# Patient Record
Sex: Female | Born: 1935 | Race: White | Hispanic: No | Marital: Married | Smoking: Former smoker
Health system: Southern US, Community
[De-identification: ages and names within clinical notes are randomized; demographics above are authoritative.]

## PROBLEM LIST (undated history)

## (undated) DIAGNOSIS — I10 Essential (primary) hypertension: Secondary | ICD-10-CM

## (undated) DIAGNOSIS — G250 Essential tremor: Secondary | ICD-10-CM

## (undated) DIAGNOSIS — B0229 Other postherpetic nervous system involvement: Secondary | ICD-10-CM

## (undated) DIAGNOSIS — G252 Other specified forms of tremor: Secondary | ICD-10-CM

## (undated) DIAGNOSIS — G4700 Insomnia, unspecified: Secondary | ICD-10-CM

## (undated) DIAGNOSIS — R300 Dysuria: Secondary | ICD-10-CM

## (undated) HISTORY — DX: Essential (primary) hypertension: I10

## (undated) HISTORY — DX: Dysuria: R30.0

## (undated) HISTORY — PX: APPENDECTOMY: SHX54

## (undated) HISTORY — DX: Other postherpetic nervous system involvement: B02.29

## (undated) HISTORY — DX: Other specified forms of tremor: G25.2

## (undated) HISTORY — DX: Hypercalcemia: E83.52

## (undated) HISTORY — DX: Insomnia, unspecified: G47.00

## (undated) HISTORY — DX: Essential tremor: G25.0

---

## 1999-09-27 HISTORY — PX: ABDOMINAL HYSTERECTOMY: SHX81

## 2000-02-10 ENCOUNTER — Encounter: Admission: RE | Admit: 2000-02-10 | Discharge: 2000-02-10 | Payer: Self-pay | Admitting: *Deleted

## 2000-03-07 ENCOUNTER — Other Ambulatory Visit: Admission: RE | Admit: 2000-03-07 | Discharge: 2000-03-07 | Payer: Self-pay | Admitting: Gynecology

## 2000-03-07 ENCOUNTER — Ambulatory Visit: Admission: RE | Admit: 2000-03-07 | Discharge: 2000-03-07 | Payer: Self-pay | Admitting: Gynecology

## 2000-03-13 ENCOUNTER — Encounter: Payer: Self-pay | Admitting: Gynecology

## 2000-03-15 ENCOUNTER — Encounter (INDEPENDENT_AMBULATORY_CARE_PROVIDER_SITE_OTHER): Payer: Self-pay | Admitting: Specialist

## 2000-03-15 ENCOUNTER — Encounter (INDEPENDENT_AMBULATORY_CARE_PROVIDER_SITE_OTHER): Payer: Self-pay

## 2000-03-15 ENCOUNTER — Inpatient Hospital Stay (HOSPITAL_COMMUNITY): Admission: RE | Admit: 2000-03-15 | Discharge: 2000-03-18 | Payer: Self-pay | Admitting: Gynecology

## 2000-04-26 ENCOUNTER — Ambulatory Visit: Admission: RE | Admit: 2000-04-26 | Discharge: 2000-04-26 | Payer: Self-pay | Admitting: Gynecology

## 2002-02-12 ENCOUNTER — Ambulatory Visit (HOSPITAL_COMMUNITY): Admission: RE | Admit: 2002-02-12 | Discharge: 2002-02-12 | Payer: Self-pay | Admitting: Family Medicine

## 2006-02-05 ENCOUNTER — Inpatient Hospital Stay (HOSPITAL_COMMUNITY): Admission: EM | Admit: 2006-02-05 | Discharge: 2006-02-09 | Payer: Self-pay | Admitting: Emergency Medicine

## 2006-02-07 ENCOUNTER — Encounter (INDEPENDENT_AMBULATORY_CARE_PROVIDER_SITE_OTHER): Payer: Self-pay | Admitting: Cardiology

## 2008-10-30 ENCOUNTER — Encounter: Payer: Self-pay | Admitting: Family Medicine

## 2009-04-29 ENCOUNTER — Ambulatory Visit: Payer: Self-pay | Admitting: Family Medicine

## 2009-04-29 DIAGNOSIS — I10 Essential (primary) hypertension: Secondary | ICD-10-CM | POA: Insufficient documentation

## 2009-04-29 DIAGNOSIS — G4700 Insomnia, unspecified: Secondary | ICD-10-CM

## 2009-04-29 HISTORY — DX: Essential (primary) hypertension: I10

## 2009-04-29 HISTORY — DX: Insomnia, unspecified: G47.00

## 2009-10-29 ENCOUNTER — Ambulatory Visit: Payer: Self-pay | Admitting: Family Medicine

## 2009-10-29 DIAGNOSIS — G250 Essential tremor: Secondary | ICD-10-CM

## 2009-10-29 DIAGNOSIS — R300 Dysuria: Secondary | ICD-10-CM

## 2009-10-29 DIAGNOSIS — G252 Other specified forms of tremor: Secondary | ICD-10-CM

## 2009-10-29 HISTORY — DX: Dysuria: R30.0

## 2009-10-29 HISTORY — DX: Essential tremor: G25.0

## 2009-10-29 LAB — CONVERTED CEMR LAB
Glucose, Urine, Semiquant: NEGATIVE
Protein, U semiquant: NEGATIVE
Specific Gravity, Urine: 1.01
Urobilinogen, UA: 0.2
pH: 7.5

## 2009-10-30 LAB — CONVERTED CEMR LAB
Chloride: 99 meq/L (ref 96–112)
Creatinine, Ser: 1 mg/dL (ref 0.4–1.2)
Glucose, Bld: 95 mg/dL (ref 70–99)

## 2009-11-09 ENCOUNTER — Telehealth: Payer: Self-pay | Admitting: Family Medicine

## 2010-04-29 ENCOUNTER — Ambulatory Visit: Payer: Self-pay | Admitting: Family Medicine

## 2010-04-29 DIAGNOSIS — B0229 Other postherpetic nervous system involvement: Secondary | ICD-10-CM

## 2010-04-29 HISTORY — DX: Other postherpetic nervous system involvement: B02.29

## 2010-04-29 HISTORY — DX: Hypercalcemia: E83.52

## 2010-04-30 LAB — CONVERTED CEMR LAB
AST: 72 units/L — ABNORMAL HIGH (ref 0–37)
Albumin: 3.9 g/dL (ref 3.5–5.2)
BUN: 18 mg/dL (ref 6–23)
Bilirubin, Direct: 0.1 mg/dL (ref 0.0–0.3)
CO2: 36 meq/L — ABNORMAL HIGH (ref 19–32)
Calcium: 11 mg/dL — ABNORMAL HIGH (ref 8.4–10.5)
Chloride: 96 meq/L (ref 96–112)
Potassium: 3.9 meq/L (ref 3.5–5.1)
Total Bilirubin: 0.5 mg/dL (ref 0.3–1.2)

## 2010-09-29 ENCOUNTER — Ambulatory Visit
Admission: RE | Admit: 2010-09-29 | Discharge: 2010-09-29 | Payer: Self-pay | Source: Home / Self Care | Attending: Family Medicine | Admitting: Family Medicine

## 2010-10-11 ENCOUNTER — Ambulatory Visit
Admission: RE | Admit: 2010-10-11 | Discharge: 2010-10-11 | Payer: Self-pay | Source: Home / Self Care | Attending: Family Medicine | Admitting: Family Medicine

## 2010-10-18 ENCOUNTER — Encounter: Payer: Self-pay | Admitting: Family Medicine

## 2010-10-19 ENCOUNTER — Encounter: Payer: Self-pay | Admitting: Family Medicine

## 2010-10-26 NOTE — Progress Notes (Signed)
Summary: REQ FOR MEDS , sinus pressure, cough  Phone Note Call from Patient   Caller: Patient [phone] Call For: Rachel Peat MD Reason for Call: Talk to Nurse, Talk to Doctor Summary of Call: Pt called to speak with Harriett Sine, LPN for Dr Caryl Never.... Pt adv the she would like to have meds sent into [facility] for c/o : sinus pressure, congestion (head/chest), ST, fatigue, initiated productive cough....OR..... if someone could call her to suggest meds.... Pt states that she had these sxs before and was diagnosed with acute bronchitis.Marland KitchenMarland KitchenMarland KitchenHas tried OTC products w/ no relief.Marland KitchenMarland KitchenMarland KitchenPt adv that she just came in for OV on 02/03/2011and didn't want to come in if she didn't have to??? ..... Pt can be reached at [phone].  Initial call taken by: Debbra Riding,  November 09, 2009 9:08 AM  Follow-up for Phone Call        OK to call in Amoxicillin 875 mg by mouth two times a day for 10 days and prompt office f/u if not improving or worsening next few days. Follow-up by: Rachel Peat MD,  November 09, 2009 10:08 AM  Additional Follow-up for Phone Call Additional follow up Details #1::        Rx faxed, pt informed and she voiced her understanding to call in not improving over the next few days Additional Follow-up by: Sid Falcon LPN,  November 09, 2009 10:42 AM    New/Updated Medications: AMOXICILLIN 875 MG TABS (AMOXICILLIN) one tab two times a day X 10 days Prescriptions: AMOXICILLIN 875 MG TABS (AMOXICILLIN) one tab two times a day X 10 days  #20 x 0   Entered by:   Sid Falcon LPN   Authorized by:   Rachel Peat MD   Signed by:   Sid Falcon LPN on 30/86/5784   Method used:   Faxed to ...       Hospital doctor (retail)       [address]       [place], [place]  [address]       Ph: [phone] or [phone]       Fax: [phone]   RxID:   [id number] AMOXICILLIN 875 MG TABS (AMOXICILLIN) one tab two times a day X 10 days  #20  x 0   Entered by:   Sid Falcon LPN   Authorized by:   Rachel Peat MD   Signed by:   Sid Falcon LPN on 16/60/6301   Method used:   Print then Give to Patient   RxID:   [id number]

## 2010-10-26 NOTE — Assessment & Plan Note (Signed)
Summary: 6 MONTH FUP//CCM   Vital Signs:  Patient profile:   77 year old female Menstrual status:  postmenopausal Weight:      104 pounds Temp:     97.8 degrees F oral BP sitting:   140 / 80  (left arm) Cuff size:   regular  Vitals Entered By: Sid Falcon LPN (April 29, 2010 1:06 PM) CC: 6 month follow-up   History of Present Illness: Patient seen today for the following  History of shingles left face. Still has some intermittent pain 6/10 severity. Tylenol helped slightly. She is reluctant to take further medications. Sleeping okay. No recent recurrent rash.  Hypertension treated for several years with low-dose HCTZ.  Blood pressures have been stable.  Chronic insomnia. Takes Klonopin 0.5 mg q.h.s. No significant daytime naps.  History of mild hypercalcemia several months ago. Possibly secondary to hydrochlorothiazide. Needs reassessment. No history of sarcoidosis, hyperparathyroidism, hyperthyroidism, or malignancy.  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    Physical  Exam  General:  Well-developed,well-nourished,in no acute distress; alert,appropriate and cooperative throughout examination Head:  Normocephalic and atraumatic without obvious abnormalities. No apparent alopecia or balding. Ears:  External ear exam shows no significant lesions or deformities.  Otoscopic examination reveals clear canals, tympanic membranes are intact bilaterally without bulging, retraction, inflammation or discharge. Hearing is grossly normal bilaterally. Mouth:  Oral mucosa and oropharynx without lesions or exudates.  Teeth in good repair. Neck:  No deformities, masses, or tenderness noted. Lungs:  Normal respiratory effort, chest expands symmetrically. Lungs are clear to auscultation, no crackles or wheezes. Heart:  normal rate and regular rhythm.   Extremities:  no edema or clubbing Neurologic:  alert & oriented X3 and cranial nerves II-XII intact.   Cervical Nodes:  No lymphadenopathy noted Psych:  normally interactive, good eye contact, not anxious appearing, and not depressed appearing.     Impression & Recommendations:  Problem # 1:  INSOMNIA, CHRONIC (ICD-307.42)  sleep hygiene discussed.  Refill klonopin.  Orders: Prescription Created Electronically [phone])  Problem # 2:  HYPERTENSION (ICD-401.9)  Her updated medication list for this problem includes:    Hydrochlorothiazide 25 Mg Tabs (Hydrochlorothiazide) ..... Once daily  Orders: Specimen Handling (88416) TLB-BMP (Basic Metabolic Panel-BMET) (80048-METABOL) TLB-Hepatic/Liver Function Pnl (80076-HEPATIC)  Problem # 3:  POSTHERPETIC NEURALGIA (ICD-053.19) discuss possible medication options. This point she is reluctant to add further medication  Problem # 4:  HYPERCALCEMIA (ICD-275.42)  reassess calcium today along with albumin.  Complete Medication List: 1)  Hydrochlorothiazide 25 Mg Tabs (Hydrochlorothiazide) .... Once daily 2)  Klonopin 0.5 Mg Tabs (Clonazepam) .... At bedtime 3)  Fish Oil 1200  Mg Caps (Omega-3 fatty acids) .... Three times a day 4)  Calcium-vitamin D 250-125 Mg-unit Tabs (Calcium carbonate-vitamin d) .... Two tabs  daily 5)  Centrum Silver Ultra Womens Tabs (Multiple vitamins-minerals) .... Once daily 6)  Amoxicillin 875 Mg Tabs (Amoxicillin) .... One tab two times a day x 10 days  Patient Instructions: 1)  Please schedule a follow-up appointment in 6 months .  Prescriptions: KLONOPIN 0.5 MG TABS (CLONAZEPAM) at bedtime  #30 x 5   Entered and Authorized by:   Evelena Peat MD   Signed by:   Evelena Peat MD on 04/29/2010   Method used:   Print then Give to Patient   RxID:   [id number]

## 2010-10-26 NOTE — Assessment & Plan Note (Signed)
Summary: 6 month rov/njr   Vital Signs:  Patient profile:   77 year old female Menstrual status:  postmenopausal Weight:      104 pounds Temp:     98.5 degrees F oral BP sitting:   150 / 78  (left arm) Cuff size:   regular  Vitals Entered By: Sid Falcon LPN (October 29, 2009 1:15 PM) CC: 6 month follow-up, Hypertension Management   History of Present Illness: Seen today for the following.  Intermittent burning off and on with urination with mild symptoms for the past several weeks. No back pain, fever, or chills. No vaginal discharge. Symptoms are very intermittent.  Hypertension treated with hydrochlorothiazide. No orthostatic symptoms. Compliant with medication. No side effects.  Chronic insomnia. Takes low-dose Klonopin 0.5 mg at night. Tried tapering off but had severe insomnia. No recent falls.  Long history of tremor involving mostly upper extremities. Somewhat progressive. Not disabling. Positive family history of tremor her father. No slowing of gait or any other new neurologic findings  Hypertension History:      She denies headache, chest pain, palpitations, dyspnea with exertion, orthopnea, PND, peripheral edema, visual symptoms, neurologic problems, syncope, and side effects from treatment.        Positive major cardiovascular risk factors include female age 75 years old or older, hypertension, and current tobacco user.     Allergies: No Known Drug Allergies  Past History:  Past Medical History: Last updated: 04/29/2009 Hypertension UTI Chicken pox chronic insomnia COPD Hypercholestolemia  Past Surgical History: Last updated: 04/29/2009 Appendectomy Hysterectomy 2001  Social History: Last updated: 04/29/2009 Retired  Diplomatic Services operational officer Married Past smoker, quit 2001 Alcohol use-no PMH-FH-SH reviewed for relevance  Review of Systems  The patient denies anorexia, fever, weight loss, chest pain, syncope, dyspnea on exertion, peripheral edema, prolonged  cough, headaches, hemoptysis, and abdominal pain.    Physical Exam  General:  Well-developed,well-nourished,in no acute distress; alert,appropriate and cooperative throughout examination Ears:  External ear exam shows no significant lesions or deformities.  Otoscopic examination reveals clear canals, tympanic membranes are intact bilaterally without bulging, retraction, inflammation or discharge. Hearing is grossly normal bilaterally. Nose:  External nasal examination shows no deformity or inflammation. Nasal mucosa are pink and moist without lesions or exudates. Mouth:  Oral mucosa and oropharynx without lesions or exudates.  Teeth in good repair. Neck:  No deformities, masses, or tenderness noted. Lungs:  Normal respiratory effort, chest expands symmetrically. Lungs are clear to auscultation, no crackles or wheezes. Heart:  normal rate and regular rhythm.   Extremities:  no edema Neurologic:  tremor involving mostly upper extremities head and neck which is exacerbated with movement.alert & oriented X3, cranial nerves II-XII intact, and gait normal.     Impression & Recommendations:  Problem # 1:  INSOMNIA, CHRONIC (ICD-307.42)  Problem # 2:  HYPERTENSION (ICD-401.9) refill meds and check labs. Her updated medication list for this problem includes:    Hydrochlorothiazide 25 Mg Tabs (Hydrochlorothiazide) ..... Once daily  Orders: Venipuncture (31497) TLB-BMP (Basic Metabolic Panel-BMET) (80048-METABOL)  Problem # 3:  FAMILIAL TREMOR (ICD-333.1) Discuss possible treatment options the patient is not interested at this time  Problem # 4:  DYSURIA (ICD-788.1) Urine dip unremarkable.  ?atrophic vaginitis.  Pt not interested in any further treatments. Orders: UA Dipstick w/o Micro (manual) (02637)  Complete Medication List: 1)  Hydrochlorothiazide 25 Mg Tabs (Hydrochlorothiazide) .... Once daily 2)  Klonopin 0.5 Mg Tabs (Clonazepam) .... At bedtime 3)  Fish Oil 1200 Mg Caps (Omega-3  fatty acids) .... Three times a day 4)  Calcium-vitamin D 250-125 Mg-unit Tabs (Calcium carbonate-vitamin d) .... Two tabs  daily 5)  Centrum Silver Ultra Womens Tabs (Multiple vitamins-minerals) .... Once daily  Hypertension Assessment/Plan:      The patient's hypertensive risk group is category B: At least one risk factor (excluding diabetes) with no target organ damage.  Today's blood pressure is 150/78.    Patient Instructions: 1)  Please schedule a follow-up appointment in 6 months .  2)  followup promptly if you develop any fever or worsening urinary symptoms. Prescriptions: KLONOPIN 0.5 MG TABS (CLONAZEPAM) at bedtime  #30 x 5   Entered and Authorized by:   Evelena Peat MD   Signed by:   Evelena Peat MD on 10/29/2009   Method used:   Print then Give to Patient   RxID:   [id number] HYDROCHLOROTHIAZIDE 25 MG TABS (HYDROCHLOROTHIAZIDE) once daily  #30 x 11   Entered and Authorized by:   Evelena Peat MD   Signed by:   Evelena Peat MD on 10/29/2009   Method used:   Faxed to ...       Hospital doctor (retail)       [address]       [place], [place]  [address]       Ph: [phone] or [phone]       Fax: [phone]   RxID:   [id number]   Laboratory Results   Urine Tests    Routine Urinalysis   Color: yellow Appearance: Clear Glucose: negative   (Normal Range: Negative) Bilirubin: negative   (Normal Range: Negative) Ketone: negative   (Normal Range: Negative) Spec. Gravity: 1.010   (Normal Range: 1.003-1.035) Blood: trace-lysed   (Normal Range: Negative) pH: 7.5   (Normal Range: 5.0-8.0) Protein: negative   (Normal Range: Negative) Urobilinogen: 0.2   (Normal Range: 0-1) Nitrite: negative   (Normal Range: Negative) Leukocyte Esterace: negative   (Normal Range: Negative)    Comments: Sid Falcon LPN  October 29, 2009 1:27 PM

## 2010-10-28 NOTE — Assessment & Plan Note (Signed)
Summary: right shoulder pain - rv   Vital Signs:  Patient profile:   77 year old female Menstrual status:  postmenopausal Weight:      104 pounds Temp:     98.4 degrees F oral BP sitting:   164 / 80  (left arm) Cuff size:   regular  Vitals Entered By: Sid Falcon LPN (September 29, 2010 4:07 PM)  History of Present Illness: L shoulder pain onset Friday night. No injury.  Pain is sharp to dull. Advil helps.  No neck pain.  Radiates to elbow. Pain at rest . no clear exacerbating factors.  Heat helps. No chest pain or pleuritic pain.  Hypertension which has generally been well controlled.  Compliant with meds. No headaches or dizziness.  Hypertension History:      She denies headache, chest pain, palpitations, dyspnea with exertion, orthopnea, peripheral edema, visual symptoms, and neurologic problems.  She notes no problems with any antihypertensive medication side effects.        Positive major cardiovascular risk factors include female age 54 years old or older, hypertension, and current tobacco user.     Allergies (verified): No Known Drug Allergies  Past History:  Past Surgical History: Last updated: 04/29/2009 Appendectomy Hysterectomy 2001  Family History: Last updated: 04/29/2009 Family History Breast cancer  Family History of Colon CA  Family History Diabetes  Family History High cholesterol Family History Hypertension  Social History: Last updated: 04/29/2009 Retired  Diplomatic Services operational officer Married Past smoker, quit 2001 Alcohol use-no  Risk Factors: Smoking Status: current (04/29/2009)  Past Medical History: Hypertension chronic insomnia COPD Hypercholestolemia PMH-FH-SH reviewed for relevance  Review of Systems      See HPI  Physical Exam  General:  Well-developed,well-nourished,in no acute distress; alert,appropriate and cooperative throughout examination Eyes:  pupils equal, pupils round, and pupils reactive to light.   Neck:  No deformities,  masses, or tenderness noted. Lungs:  Normal respiratory effort, chest expands symmetrically. Lungs are clear to auscultation, no crackles or wheezes. Heart:  normal rate and regular rhythm.   Extremities:  L shoulder no edema.  Good ROM but has tenderness subacromial region.  Pain with int rotation and abduction .  Good distal wrist pulses. Neurologic:  alert & oriented X3 and cranial nerves II-XII intact.     Impression & Recommendations:  Problem # 1:  SHOULDER PAIN (ICD-719.41)  Suspect rotator cuff tendonitis vs bursitis.  Rec consider steroid injection and avoid further NSAIDS, esp with her age and elev BP. discussed risks and benefits of subacromial injection of steroid and pt consents.  L shoulder prepped with betadine and using 25 gauge 1inch needle injected 40 mg depomedrol and 1 cc plain xylocaine.  Orders: Joint Aspirate / Injection, Large (20610)  Problem # 2:  HYPERTENSION (ICD-401.9) Assessment: Deteriorated probably exacerbated by pain and recent NSaid use.  reassess in 2 weeks and consider additional meds then if no better. Her updated medication list for this problem includes:    Hydrochlorothiazide 25 Mg Tabs (Hydrochlorothiazide) ..... Once daily  Complete Medication List: 1)  Hydrochlorothiazide 25 Mg Tabs (Hydrochlorothiazide) .... Once daily 2)  Klonopin 0.5 Mg Tabs (Clonazepam) .... At bedtime 3)  Fish Oil 1200 Mg Caps (Omega-3 fatty acids) .... Three times a day 4)  Calcium-vitamin D 250-125 Mg-unit Tabs (Calcium carbonate-vitamin d) .... Two tabs  daily 5)  Centrum Silver Ultra Womens Tabs (Multiple vitamins-minerals) .... Once daily 6)  Amoxicillin 875 Mg Tabs (Amoxicillin) .... One tab two times a day x 10  days  Hypertension Assessment/Plan:      The patient's hypertensive risk group is category B: At least one risk factor (excluding diabetes) with no target organ damage.  Today's blood pressure is 164/80.    Patient Instructions: 1)  Avoid further  use  of Advil. 2)  Please schedule a follow-up appointment in 2 weeks.    Orders Added: 1)  Joint Aspirate / Injection, Large [20610] 2)  Est. Patient Level III [16109]

## 2010-10-28 NOTE — Assessment & Plan Note (Signed)
Summary: 2 wk rov/njr   Vital Signs:  Patient profile:   77 year old female Menstrual status:  postmenopausal Weight:      101.5 pounds Temp:     98.8 degrees F oral BP sitting:   120 / 80  (left arm) Cuff size:   regular  Vitals Entered By: Sid Falcon LPN (October 11, 2010 11:09 AM) CC: 2 week follow-up, med refill Is Patient Diabetic? No   History of Present Illness: Patient for followup left shoulder pain and elevated blood pressure. She had been taking some Advil. Excellent response from steroid injection. No pain whatsoever left shoulder at this time. pressure also greatly improved. Compliant with medications. Also needs refills Klonopin which she takes at bedtime for chronic insomnia. No recent falls.  Allergies (verified): No Known Drug Allergies  Past History:  Past Medical History: Last updated: 09/29/2010 Hypertension chronic insomnia COPD Hypercholestolemia  Review of Systems  The patient denies anorexia, fever, weight loss, chest pain, syncope, dyspnea on exertion, peripheral edema, prolonged cough, headaches, and abdominal pain.    Physical Exam  General:  Well-developed,well-nourished,in no acute distress; alert,appropriate and cooperative throughout examination Lungs:  Normal respiratory effort, chest expands symmetrically. Lungs are clear to auscultation, no crackles or wheezes. Heart:  Normal rate and regular rhythm. S1 and S2 normal without gallop, murmur, click, rub or other extra sounds. Extremities:  left shoulder nontender. Excellent range of motion.   Impression & Recommendations:  Problem # 1:  HYPERTENSION (ICD-401.9) Assessment Improved  Her updated medication list for this problem includes:    Hydrochlorothiazide 25 Mg Tabs (Hydrochlorothiazide) ..... Once daily  Problem # 2:  INSOMNIA, CHRONIC (ICD-307.42) refill Klonopin  Problem # 3:  SHOULDER PAIN (ICD-719.41) Assessment: Improved  Complete Medication List: 1)   Hydrochlorothiazide 25 Mg Tabs (Hydrochlorothiazide) .... Once daily 2)  Klonopin 0.5 Mg Tabs (Clonazepam) .... At bedtime 3)  Fish Oil 1200 Mg Caps (Omega-3 fatty acids) .... Three times a day 4)  Calcium-vitamin D 250-125 Mg-unit Tabs (Calcium carbonate-vitamin d) .... Two tabs  daily 5)  Centrum Silver Ultra Womens Tabs (Multiple vitamins-minerals) .... Once daily  Patient Instructions: 1)  Check your  Blood Pressure regularly . If it is above: 140/90  you should make an appointment. Prescriptions: KLONOPIN 0.5 MG TABS (CLONAZEPAM) at bedtime  #30 x 5   Entered and Authorized by:   Evelena Peat MD   Signed by:   Evelena Peat MD on 10/11/2010   Method used:   Print then Give to Patient   RxID:   [id number]    Orders Added: 1)  Est. Patient Level III [14782]

## 2010-10-30 ENCOUNTER — Emergency Department (HOSPITAL_COMMUNITY): Payer: Medicare Other

## 2010-10-30 ENCOUNTER — Inpatient Hospital Stay (HOSPITAL_COMMUNITY)
Admission: EM | Admit: 2010-10-30 | Discharge: 2010-11-02 | DRG: 191 | Disposition: A | Discharge status: Home / Self Care | Payer: Medicare Other | Attending: Internal Medicine | Admitting: Internal Medicine

## 2010-10-30 ENCOUNTER — Encounter (HOSPITAL_COMMUNITY): Payer: Self-pay

## 2010-10-30 DIAGNOSIS — M129 Arthropathy, unspecified: Secondary | ICD-10-CM | POA: Diagnosis present

## 2010-10-30 DIAGNOSIS — R911 Solitary pulmonary nodule: Secondary | ICD-10-CM | POA: Diagnosis present

## 2010-10-30 DIAGNOSIS — Z87891 Personal history of nicotine dependence: Secondary | ICD-10-CM

## 2010-10-30 DIAGNOSIS — R0902 Hypoxemia: Secondary | ICD-10-CM | POA: Diagnosis present

## 2010-10-30 DIAGNOSIS — Z79899 Other long term (current) drug therapy: Secondary | ICD-10-CM

## 2010-10-30 DIAGNOSIS — Z7982 Long term (current) use of aspirin: Secondary | ICD-10-CM

## 2010-10-30 DIAGNOSIS — IMO0002 Reserved for concepts with insufficient information to code with codable children: Secondary | ICD-10-CM

## 2010-10-30 DIAGNOSIS — I10 Essential (primary) hypertension: Secondary | ICD-10-CM | POA: Diagnosis present

## 2010-10-30 DIAGNOSIS — E871 Hypo-osmolality and hyponatremia: Secondary | ICD-10-CM | POA: Diagnosis present

## 2010-10-30 DIAGNOSIS — D649 Anemia, unspecified: Secondary | ICD-10-CM | POA: Diagnosis present

## 2010-10-30 DIAGNOSIS — J441 Chronic obstructive pulmonary disease with (acute) exacerbation: Principal | ICD-10-CM | POA: Diagnosis present

## 2010-10-30 LAB — BASIC METABOLIC PANEL
BUN: 15 mg/dL (ref 6–23)
CO2: 31 mEq/L (ref 19–32)
Calcium: 9.7 mg/dL (ref 8.4–10.5)
Chloride: 94 mEq/L — ABNORMAL LOW (ref 96–112)
Creatinine, Ser: 0.64 mg/dL (ref 0.4–1.2)
GFR calc Af Amer: >60 mL/min (ref >60)
GFR calc non Af Amer: >60 mL/min (ref >60)
Glucose, Bld: 143 mg/dL — ABNORMAL HIGH (ref 70–99)
Potassium: 3.9 mEq/L (ref 3.5–5.1)
Sodium: 136 mEq/L (ref 135–145)

## 2010-10-30 LAB — POCT I-STAT, CHEM 8
BUN: 16 mg/dL (ref 6–23)
Calcium, Ion: 1.12 mmol/L (ref 1.12–1.32)
Chloride: 95 mEq/L — ABNORMAL LOW (ref 96–112)
Creatinine, Ser: 0.9 mg/dL (ref 0.4–1.2)
Glucose, Bld: 140 mg/dL — ABNORMAL HIGH (ref 70–99)
HCT: 31 % — ABNORMAL LOW (ref 36.0–46.0)
Hemoglobin: 10.5 g/dL — ABNORMAL LOW (ref 12.0–15.0)
Potassium: 3.8 mEq/L (ref 3.5–5.1)
Sodium: 133 mEq/L — ABNORMAL LOW (ref 135–145)
TCO2: 34 mmol/L (ref 0–100)

## 2010-10-30 LAB — CREATININE, URINE, RANDOM: Creatinine, Urine: 59.1 mg/dL

## 2010-10-30 LAB — DIFFERENTIAL
Basophils Absolute: 0 10*3/uL (ref 0.0–0.1)
Eosinophils Absolute: 0.1 10*3/uL (ref 0.0–0.7)
Eosinophils Relative: 1 % (ref 0–5)
Monocytes Absolute: 0.9 10*3/uL (ref 0.1–1.0)
Neutrophils Relative %: 70 % (ref 43–77)

## 2010-10-30 LAB — CBC
MCH: 29.7 pg (ref 26.0–34.0)
RBC: 3.44 MIL/uL — ABNORMAL LOW (ref 3.87–5.11)
WBC: 5.8 10*3/uL (ref 4.0–10.5)

## 2010-10-30 LAB — SODIUM, URINE, RANDOM: Sodium, Ur: 76 mEq/L

## 2010-10-30 LAB — CK TOTAL AND CKMB (NOT AT ARMC): CK, MB: 7.4 ng/mL (ref 0.3–4.0)

## 2010-10-30 MED ORDER — IOHEXOL 300 MG/ML  SOLN
100.0000 mL | Freq: Once | INTRAMUSCULAR | Status: AC | PRN
  Administered 2010-10-30: 75 mL via INTRAVENOUS

## 2010-10-31 ENCOUNTER — Inpatient Hospital Stay (HOSPITAL_COMMUNITY): Payer: Medicare Other

## 2010-10-31 LAB — LIPID PANEL
HDL: 95 mg/dL (ref >39)
Total CHOL/HDL Ratio: 2.1 RATIO
Triglycerides: 29 mg/dL (ref <150)
VLDL: 6 mg/dL (ref 0–40)

## 2010-10-31 LAB — CARDIAC PANEL(CRET KIN+CKTOT+MB+TROPI)
CK, MB: 8.2 ng/mL (ref 0.3–4.0)
Relative Index: 5.6 — ABNORMAL HIGH (ref 0.0–2.5)
Total CK: 171 U/L (ref 7–177)
Total CK: 147 U/L (ref 7–177)

## 2010-10-31 LAB — OSMOLALITY, URINE: Osmolality, Ur: 564 mOsm/kg (ref 390–1090)

## 2010-10-31 LAB — DIFFERENTIAL
Basophils Relative: 0 % (ref 0–1)
Eosinophils Relative: 0 % (ref 0–5)
Lymphs Abs: 0.3 10*3/uL — ABNORMAL LOW (ref 0.7–4.0)
Monocytes Absolute: 0.3 10*3/uL (ref 0.1–1.0)
Monocytes Relative: 5 % (ref 3–12)
Neutrophils Relative %: 90 % — ABNORMAL HIGH (ref 43–77)

## 2010-10-31 LAB — CBC
HCT: 31.1 % — ABNORMAL LOW (ref 36.0–46.0)
MCHC: 32.2 g/dL (ref 30.0–36.0)
MCV: 90.1 fL (ref 78.0–100.0)
Platelets: 202 10*3/uL (ref 150–400)
RBC: 3.45 MIL/uL — ABNORMAL LOW (ref 3.87–5.11)
RDW: 14.2 % (ref 11.5–15.5)
WBC: 6.3 10*3/uL (ref 4.0–10.5)

## 2010-10-31 LAB — COMPREHENSIVE METABOLIC PANEL
ALT: 53 U/L — ABNORMAL HIGH (ref 0–35)
Albumin: 2.8 g/dL — ABNORMAL LOW (ref 3.5–5.2)
Alkaline Phosphatase: 80 U/L (ref 39–117)
CO2: 33 mEq/L — ABNORMAL HIGH (ref 19–32)
Creatinine, Ser: 0.67 mg/dL (ref 0.4–1.2)
GFR calc Af Amer: >60 mL/min (ref >60)
Total Bilirubin: 0.3 mg/dL (ref 0.3–1.2)

## 2010-10-31 LAB — BRAIN NATRIURETIC PEPTIDE: Pro B Natriuretic peptide (BNP): 196 pg/mL — ABNORMAL HIGH (ref 0.0–100.0)

## 2010-10-31 LAB — PHOSPHORUS: Phosphorus: 3.3 mg/dL (ref 2.3–4.6)

## 2010-10-31 LAB — APTT: aPTT: 33 seconds (ref 24–37)

## 2010-10-31 LAB — TSH: TSH: 1.414 u[IU]/mL (ref 0.350–4.500)

## 2010-10-31 MED ORDER — TECHNETIUM TO 99M ALBUMIN AGGREGATED
6.0000 | Freq: Once | INTRAVENOUS | Status: AC | PRN
  Administered 2010-10-31: 6 via INTRAVENOUS

## 2010-10-31 MED ORDER — XENON XE 133 GAS
10.0000 | GAS_FOR_INHALATION | Freq: Once | RESPIRATORY_TRACT | Status: AC | PRN
  Administered 2010-10-31: 10 via RESPIRATORY_TRACT

## 2010-11-01 ENCOUNTER — Ambulatory Visit: Payer: Self-pay | Admitting: Family Medicine

## 2010-11-01 LAB — CBC
HCT: 30.2 % — ABNORMAL LOW (ref 36.0–46.0)
MCH: 29.2 pg (ref 26.0–34.0)
MCHC: 32.1 g/dL (ref 30.0–36.0)
MCV: 91 fL (ref 78.0–100.0)
Platelets: 225 10*3/uL (ref 150–400)
RDW: 14.4 % (ref 11.5–15.5)
WBC: 9 10*3/uL (ref 4.0–10.5)

## 2010-11-01 LAB — BASIC METABOLIC PANEL
BUN: 13 mg/dL (ref 6–23)
Calcium: 9.2 mg/dL (ref 8.4–10.5)
Creatinine, Ser: 0.79 mg/dL (ref 0.4–1.2)
GFR calc non Af Amer: >60 mL/min (ref >60)
Glucose, Bld: 104 mg/dL — ABNORMAL HIGH (ref 70–99)

## 2010-11-01 LAB — IRON AND TIBC: Iron: 68 ug/dL (ref 42–135)

## 2010-11-01 LAB — FERRITIN: Ferritin: 63 ng/mL (ref 10–291)

## 2010-11-02 LAB — CBC
HCT: 33.4 % — ABNORMAL LOW (ref 36.0–46.0)
MCV: 90.3 fL (ref 78.0–100.0)
RBC: 3.7 MIL/uL — ABNORMAL LOW (ref 3.87–5.11)
RDW: 14.2 % (ref 11.5–15.5)
WBC: 10.5 10*3/uL (ref 4.0–10.5)

## 2010-11-02 LAB — BASIC METABOLIC PANEL
BUN: 12 mg/dL (ref 6–23)
Chloride: 97 mEq/L (ref 96–112)
GFR calc non Af Amer: >60 mL/min (ref >60)
Glucose, Bld: 104 mg/dL — ABNORMAL HIGH (ref 70–99)
Potassium: 3.7 mEq/L (ref 3.5–5.1)
Sodium: 137 mEq/L (ref 135–145)

## 2010-11-05 LAB — CULTURE, BLOOD (ROUTINE X 2)
Culture  Setup Time: 201202042044
Culture  Setup Time: 201202042044
Culture: NO GROWTH

## 2010-11-07 NOTE — H&P (Signed)
[name], [name]               ACCOUNT NO.:  [id number]  MEDICAL RECORD NO.:  [id number]           PATIENT TYPE:  I  LOCATION:  4702                         FACILITY:  [facility]  PHYSICIAN:  Michiel Cowboy, MDDATE OF BIRTH:  19-May-1921  DATE OF ADMISSION:  10/30/2010 DATE OF DISCHARGE:                             HISTORY & PHYSICAL   PRIMARY CARE PROVIDER:  Evelena Peat, MD  CHIEF COMPLAINT:  Shortness of breath.  HISTORY OF PRESENT ILLNESS:  The patient is an 77 year old female with past medical history significant for hypertension and arthritis.  The patient started to have cold-like symptoms about a week ago with runny nose, sore throat, and increased cough with increased mucus production and she progressed and started to get more short of breath especially with exertion.  She continues to exercise and walks up to a mile a day. Today, she felt severe shortness of breath and eventually presented to the emergency department.  She was very hypoxic on room air and was put on oxygen and started to improve.  Her O2 sat on room air is in high 70s.  The patient had not had any fever.  No chest pain.  No lower extremity swelling.  No nausea.  No vomiting.  No abdominal discomfort. No diarrhea or constipation or other complaints.  REVIEW OF SYSTEMS:  Otherwise negative.  PAST MEDICAL HISTORY:  Significant for arthritis and hypertension.  SOCIAL HISTORY:  The patient is to be a heavy smoker.  She smokes all her life and only quit in 2001.  She does not drink or abuse drugs.  FAMILY HISTORY:  Noncontributory.  ALLERGIES:  AMOXICILLIN.  MEDICATIONS: 1. Calcium. 2. Hydrochlorothiazide 25 mg daily. 3. Multiple vitamins.  PHYSICAL EXAMINATION:  VITAL SIGNS:  Temperature 98.5, blood pressure initially 169/107, respirations 20, heart rate 88, saturating 88% on room air at rest and 71% on room air exertion, 98% on 4 L. GENERAL:  The patient appears to be in no acute  distress. HEENT:  Head is nontraumatic.  Moist mucous membranes. LUNGS:  A very distant breath sounds bilaterally.  No wheezes appreciated. ABDOMEN:  Soft, nontender, and nondistended. EXTREMITIES:  Lower extremities without clubbing, cyanosis, or edema. NEUROLOGIC:  Grossly intact. HEART:  Regular rate and rhythm.  No murmurs appreciated. SKIN:  No abnormalities noted.  LABORATORY DATA:  White blood cell count 5.8, hemoglobin 10.5, sodium 153, potassium 3.8, creatinine 0.9, calcium 9.7.  Chest x-ray showing mild COPD.  CT of the chest showing a few small nodules.  No mass or pneumonia noted.  Moderate COPD changes noted.  The patient had a normal Myoview in 2007.  No EKG is on the chart.  ASSESSMENT AND PLAN:  This is an 77 year old female with past history of heavy tobacco abuse, although no diagnosis chronic obstructive pulmonary disease, but it is prior safe to say that based on her chest x-ray and presentation she likely does have undiagnosed chronic obstructive pulmonary disease.  She presents with shortness of breath.  Likely chronic obstructive pulmonary disease exacerbation.  This is a new diagnosis for the patient.  We will admit and give aggressive breathing  treatments, albuterol and Atrovent, start on Avelox, start on Advair, and put on oxygen.  The patient may need to be on oxygen at baseline.  We will help for her cough with Mucinex.  Robitussin as needed.  We will give her a prednisone taper.  Shortness of breath.  Other things in differential include cardiac etiology, this is somewhat less likely given no chest pain.  The patient is not diabetic and had a negative stress test 2007 for completion.  We will cycle cardiac markers and obtain EKG.  We will obtain a D-dimer as well to evaluate for pulmonary embolism although she is fairly low probability.  Hypertension, continue hydrochlorothiazide.  Low sodium, very mildly.  We will give gentle IV fluids and  follow.  We will also check urine, electrolytes, and TSH.  Prophylaxis, Protonix and Lovenox.  The patient wishes to be full code at this point.     Michiel Cowboy, MD     AVD/MEDQ  D:  10/30/2010  T:  10/30/2010  Job:  [id number]  cc:   Evelena Peat, M.D.  Electronically Signed by Therisa Doyne MD on 11/06/2010 07:26:26 PM

## 2010-11-07 NOTE — Discharge Summary (Signed)
[name], [name]               ACCOUNT NO.:  [id number]  MEDICAL RECORD NO.:  [id number]           PATIENT TYPE:  I  LOCATION:  4702                         FACILITY:  [facility]  PHYSICIAN:  Hartley Barefoot, MD    DATE OF BIRTH:  07/30/1921  DATE OF ADMISSION:  10/30/2010 DATE OF DISCHARGE:  11/02/2010                              DISCHARGE SUMMARY   DISCHARGE DIAGNOSES: 1. Chronic obstructive pulmonary disease exacerbation. 2. Small pulmonary nodule, 4 mm by CT scan.  The patient will need a     CT in 1 year to follow up nodule.  PAST MEDICAL HISTORY: 1. Hypertension. 2. Arthritis.  DISCHARGE MEDICATIONS: 1. Albuterol inhaler 90 mcg inhaled every 4-6 hours as needed. 2. Ipratropium 70 mcg inhaled every 6 hours as needed. 3. Aspirin 81 mg p.o. daily. 4. Ferrous sulfate 325 p.o. twice daily. 5. Fluticasone salmeterol 250/50 one puff inhaled twice a day. 6. Guaifenesin 600 mg by mouth twice daily. 7. Moxifloxacin 400 mg p.o. daily. 8. Prednisone taper 20 mg take 3 tablets by mouth daily for 2 days,     then 2 tablets by mouth for 3 days, then 1 tablet for 1 day, then     1/2 tablet for 1 day, and then stop. 9. Calcium 500 mg 1 tablet by mouth twice daily. 10.Clonazepam 0.5 mg 1 tablet by mouth daily at bedtime. 11.Fish oil 1200 mg 1 capsule by mouth 3 times a day. 12.Hydrochlorothiazide 25 mg p.o. daily. 13.Multivitamins 1 tablet daily. 14.Restasis 1 drop in the left eye 4 times daily.  DISPOSITION AND FOLLOWUP:  [name] will follow with her primary care physician, Dr. Evelena Peat.  During that appointment, improvement of her shortness of breath need to be assessed and requirement for home oxygen need to be readdressed.  She might need a pulmonary function test to evaluate her COPD.  STUDIES PERFORMED: 1. A V/Q scan showed COPD.  No evidence for pulmonary embolism. 2. CT chest showed no evidence for airspace consolidation or pulmonary     mass.  Chronic changes  of COPD.  Small nonspecific pulmonary     nodules.  The largest is in the right middle lobe, measuring 4.5     mm.  If the patient is at high risk for bronchogenic carcinoma,     follow up CT chest at least 1 year is recommended. 3. Chest x-ray, October 30, 2010 showed right infrahilar soft tissue     tumor.  Although, this could represent a combination of enlarged     pulmonary artery and mild pectus deformities, right hilar or     perihilar mass cannot be excluded.  Further evaluation with     contrast CT is recommended.  BRIEF HISTORY OF PRESENT ILLNESS:  The patient is an 77 year old with past medical history of hypertension and ascites who presented with cold- like symptoms about a week ago with runny nose and sore throat.  She has been having increasing cough and increased mucus production.  She was having severe shortness of breath and eventually presented to the emergency department.  She was hypoxic on room air.  Her  hypoxia improved on oxygen.  She has a prior history of heavy smoking, quit in 2001. 1. COPD exacerbation.  The patient was admitted to telemetry.  A V/Q     scan was ordered and it was negative for PE.  Chest x-ray findings     consistent with COPD.  With her history of smoking, it is probably     that she had COPD.  She was started on prednisone, Avelox,     albuterol, and Advair.  The patient's shortness of breath improved     during this hospitalization.  On day of discharge, her shortness of     breath was improved. 2. Anemia.  The patient was found to have a hemoglobin of 10.  Anemia     panel showed iron of 68 with iron binding capacity 299, ferritin     was at 63.  I will give her a trial of ferrous sulfate.  She will     need to follow with her primary care physician and discuss benefits from     colonoscopy, although at her age risks and benefits will need     to be sorted out with the patient. 3. Mass on chest x-ray.  CT chest was negative for mass and  it was     also negative for PE. 4. Small pulmonary nodule on CT.  She will need a CT to follow up lung     nodule.  CONDITION ON DISCHARGE:  On the day of discharge, the patient was in improved condition.  No shortness of breath.  DISCHARGE VITAL SIGNS:  Pulse 68, temperature 97.9, respirations 17, sats 94 on 1 L.  DISCHARGE LABORATORY DATA:  White blood cell 10.5, hemoglobin 10.7, and platelets 264.  Sodium 137, potassium 3.7, chloride 97, CO2 22, glucose 104, BUN 12, and creatinine 0.83.  The patient is qualified for home oxygen.  DISPOSITION:  The patient was discharged in improved condition.     Hartley Barefoot, MD     BR/MEDQ  D:  11/02/2010  T:  11/03/2010  Job:  [id number]  cc:   Evelena Peat, M.D.  Electronically Signed by Hartley Barefoot MD on 11/07/2010 10:29:19 PM

## 2010-11-10 ENCOUNTER — Encounter: Payer: Self-pay | Admitting: Family Medicine

## 2010-11-11 ENCOUNTER — Ambulatory Visit (INDEPENDENT_AMBULATORY_CARE_PROVIDER_SITE_OTHER): Payer: Medicare Other | Admitting: Family Medicine

## 2010-11-11 ENCOUNTER — Encounter: Payer: Self-pay | Admitting: Family Medicine

## 2010-11-11 DIAGNOSIS — J449 Chronic obstructive pulmonary disease, unspecified: Secondary | ICD-10-CM

## 2010-11-11 DIAGNOSIS — D649 Anemia, unspecified: Secondary | ICD-10-CM

## 2010-11-11 DIAGNOSIS — I10 Essential (primary) hypertension: Secondary | ICD-10-CM

## 2010-11-11 NOTE — Patient Instructions (Signed)
Continue iron tablet. May discontinue oxygen at this time.

## 2010-11-11 NOTE — Progress Notes (Signed)
  Subjective:    Patient ID: [name], female    DOB: 09/01/1921, 77 y.o.   MRN: [id number]  HPI  Patient is seen for hospital followup. She was admitted on the fourth of this month with COPD exacerbation. No reported pneumonia. Long history of smoking and quit 2001. She had not been maintained on oxygen or any regular inhalers prior to this admission and has been remarkably stable. The symptoms started with upper respiratory type infection. She was admitted and chest x-ray which showed no infiltrate. Questionable lung mass. CT scan only showed 4.5 mm right middle lobe nodule with consideration for followup in one year. VQ scan no pulmonary embolus.  Patient treated with Avelox and prednisone and feels back to baseline this time. Discharged on home oxygen was discharged on oxygen 94% 1 L. She feels fine off oxygen at this time.   Patient also had hemoglobin 10 with normal iron studies and normal TIBC. Discharged on iron. Patient refuses colonoscopy at this time. No bloody stools. No recent hemoglobin for comparison   Review of Systems  Constitutional: Positive for activity change and fatigue. Negative for fever, chills and appetite change.  HENT: Negative for ear pain and sore throat.   Respiratory: Negative for cough, chest tightness, wheezing and stridor.   Cardiovascular: Negative for chest pain.  Gastrointestinal: Negative for abdominal pain and diarrhea.  Genitourinary: Negative for dysuria.  Neurological: Negative for syncope.       Objective:   Physical Exam  patient is alert and with the number distress Oropharynx is moist and clear Eardrums normal Neck supple no adenopathy Chest clear to auscultation. Minimally decreased breath sounds. No wheezes. Heart regular rhythm and rate Extremities no edema Skin exam no rash       Assessment & Plan:   #1 COPD exacerbation #2 hypertension stable  #3 normocytic anemia  #4Nonspecific pulmonary nodule    Discussed options  with patient. She refuses colonoscopy which is probably reasonable given her age and overall health. Her oxygen is 97% on 2 L and 93% on room air after ambulation. She'll discontinue oxygen this time and use as needed. Continue her inhalers of Advair and Atrovent. Return one month for spirometry and repeat hemoglobin at that time

## 2010-11-17 ENCOUNTER — Telehealth: Payer: Self-pay | Admitting: *Deleted

## 2010-11-17 NOTE — Telephone Encounter (Signed)
Spoke with becky at [facility] - gave verbal order to dc o2 tx - was asked to fax written - to [phone].   Done  KIK

## 2010-11-17 NOTE — Telephone Encounter (Signed)
VM from pt requesting Dr Caryl Never give the order to [facility] [phone]) order to come to home and pick-up the O2 equipment she no longer needs

## 2010-11-17 NOTE — Telephone Encounter (Signed)
OK to give order to d/c oxygen.

## 2010-11-19 ENCOUNTER — Telehealth: Payer: Self-pay | Admitting: Family Medicine

## 2010-11-19 NOTE — Telephone Encounter (Signed)
Spoke with wife - we have faxed order to dc o2 and equipment on the 22nd and the 23rd. recv'd confirmations

## 2010-11-19 NOTE — Telephone Encounter (Signed)
Pt called to adv that someone from [facility] needs to call [facility] and let them know that they can p/u their O2 equipment because they have to obtain a verbal order to d/c oxygen.... Pt has been taken off of O2 and doesn't need equipment.... Pt # [id number].

## 2010-11-26 ENCOUNTER — Other Ambulatory Visit: Payer: Self-pay | Admitting: Family Medicine

## 2010-11-26 DIAGNOSIS — I10 Essential (primary) hypertension: Secondary | ICD-10-CM

## 2010-12-09 ENCOUNTER — Encounter: Payer: Self-pay | Admitting: Family Medicine

## 2010-12-09 ENCOUNTER — Ambulatory Visit (INDEPENDENT_AMBULATORY_CARE_PROVIDER_SITE_OTHER): Payer: Medicare Other | Admitting: Family Medicine

## 2010-12-09 DIAGNOSIS — I10 Essential (primary) hypertension: Secondary | ICD-10-CM

## 2010-12-09 DIAGNOSIS — J4489 Other specified chronic obstructive pulmonary disease: Secondary | ICD-10-CM

## 2010-12-09 DIAGNOSIS — D649 Anemia, unspecified: Secondary | ICD-10-CM

## 2010-12-09 DIAGNOSIS — J449 Chronic obstructive pulmonary disease, unspecified: Secondary | ICD-10-CM

## 2010-12-09 LAB — POCT HEMOGLOBIN: Hemoglobin: 10

## 2010-12-09 MED ORDER — TIOTROPIUM BROMIDE MONOHYDRATE 18 MCG IN CAPS: 18.0000 ug | ORAL_CAPSULE | Freq: Every day | RESPIRATORY_TRACT | Status: DC

## 2010-12-09 NOTE — Progress Notes (Signed)
  Subjective:    Patient ID: [name], female    DOB: [birth year], 77 y.o.   MRN: [id number]  HPI  patient seen for followup. Recent admission for COPD exacerbation. She quit smoking several years ago.  Prior to admission, very quiet disease. Was not using any inhalers until her recent admission. She was discharged on Atrovent and Advair but apparently not been compliant with either. She is apparently taking only albuterol just before bedtime. Walking for exercise without difficulty. No cough. No dyspnea. Off oxygen at this time and tolerating well.    Recent normocytic anemia. Patient refusing further workup. No bloody stools. Good appetite. We had recommended repeat hemoglobin today. She does remain on iron supplement.  Hypertension has been stable.  Compliant with meds.  No orthostatic symptoms.   Review of Systems  Constitutional: Negative for fever, chills, activity change, appetite change and unexpected weight change.  HENT: Negative for congestion.   Respiratory: Negative for cough, shortness of breath and wheezing.   Cardiovascular: Negative for chest pain, palpitations and leg swelling.  Gastrointestinal: Negative for abdominal pain.  Genitourinary: Negative for dysuria.  Neurological: Negative for dizziness, syncope and headaches.  Hematological: Does not bruise/bleed easily.  Psychiatric/Behavioral: Negative for dysphoric mood.       Objective:   Physical Exam     Patient is alert and cooperative in no distress. TMs normal. Neck no adenopathy. Oropharynx moist and clear Chest clear to auscultation Heart regular rhythm and rate Extremities no edema    Assessment & Plan:   #1 COPD. We've recommended a trial of Spiriva one puff daily. Discontinue Advair and Atrovent what she is already not using regularly. Reassess 3 months time #2 anemia. Reassess hemoglobin. Patient again refuses further evaluation with testing such as colonoscopy today. #3 hypertension stable.

## 2010-12-10 NOTE — Progress Notes (Signed)
Quick Note:  Pt husband informed ______ 

## 2011-02-07 ENCOUNTER — Ambulatory Visit (INDEPENDENT_AMBULATORY_CARE_PROVIDER_SITE_OTHER): Payer: Medicare Other | Admitting: Family Medicine

## 2011-02-07 ENCOUNTER — Encounter: Payer: Self-pay | Admitting: Family Medicine

## 2011-02-07 VITALS — BP 120/80 | Temp 98.8°F | Wt 108.0 lb

## 2011-02-07 DIAGNOSIS — L02419 Cutaneous abscess of limb, unspecified: Secondary | ICD-10-CM

## 2011-02-07 DIAGNOSIS — L03119 Cellulitis of unspecified part of limb: Secondary | ICD-10-CM

## 2011-02-07 MED ORDER — CEPHALEXIN 500 MG PO CAPS: 500.0000 mg | ORAL_CAPSULE | Freq: Three times a day (TID) | ORAL | Status: AC

## 2011-02-07 NOTE — Progress Notes (Signed)
  Subjective:    Patient ID: [name], female    DOB: [birth year], 77 y.o.   MRN: [id number]  HPI Patient seen with possible infection right anterior leg. Recently saw dermatologist and treated with liquid nitrogen in March. Subsequently treated last Monday again with liquid nitrogen. Now has some erythema and tenderness right lower leg. No fever or chills. Minimal pain with ambulation. She has been elevating frequently. Using topical Neosporin. Patient reports allergy to amoxicillin. She had some itching. No anaphylaxis.   Review of Systems  Constitutional: Negative for fever and chills.  Musculoskeletal: Negative for gait problem.       Objective:   Physical Exam  Constitutional: She appears well-developed and well-nourished.  Cardiovascular: Normal rate, regular rhythm and normal heart sounds.   Pulmonary/Chest: No respiratory distress. She has no wheezes. She has no rales.  Musculoskeletal:       Right anterior leg reveals 2 x 2 centimeter eschar lower leg. She has increased warmth, tenderness, and erythema involving most of the mid to lower leg region. Note. No purulent drainage.          Assessment & Plan:  Cellulitis right leg following liquid nitrogen treatment. Elevate frequently. Heating pad several times daily. Cephalexin 500 mg 3 times a day for 10 days. Reassess in one week

## 2011-02-07 NOTE — Patient Instructions (Signed)
Elevate leg frequently. Use heating pad on low heat several times daily.

## 2011-02-11 NOTE — Op Note (Signed)
[facility]  Patient:    [name], [name]                      MRN: [id number] Proc. Date: 03/15/00 Adm. Date:  [id number] Disc. Date: [id number] Attending:  Jeannette Corpus CC:         Bing Neighbors. Clearance Coots, M.D.             Telford Nab, N.P.             Bertram Millard. Hyacinth Meeker, M.D. - [place] [facility]                           Operative Report  PREOPERATIVE DIAGNOSIS:  Complex abdominal pelvic mass, rule out ovarian cancer.  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  PROCEDURE:  Exploratory laparotomy, lysis of adhesions, left ureterolysis, total abdominal hysterectomy, and bilateral salpingo-oophorectomy.  SURGEON:  Daniel L. Clarke-Pearson, M.D.  ASSISTANT:  Bing Neighbors. Clearance Coots, M.D. and Telford Nab, N.P.  ANESTHESIA:  General with orotracheal tube.  ESTIMATED BLOOD LOSS:  50 cc.  FINDINGS:  At exploratory laparotomy the upper abdomen was entirely normal. The small bowel, colon, and omentum were also normal.  The appendix had previously een removed.  The left ovary was replaced by a 15.0 cm complex solid and cystic mass which was densely adherent to the sigmoid colon mesentery, the pelvic peritoneum, and cul-de-sac.  There was extensive retroperitoneal fibrosis on the left side.  The right ovary was densely adherent to the posterior aspect of the uterus and he sigmoid colon was densely adherent to the back of the uterus as well.  The ovary on the right, aside from being adherent, was normal.  On frozen section the pathologist told us this is a benign mucinous cystadenoma and Brenners tumor of  the ovary.  DESCRIPTION OF PROCEDURE:  The patient was taken to the operating room and after satisfactory attainment of general anesthesia, was placed in the modified lithotomy position and the Allen stirrups.  The anterior  abdominal wall, perineum, and vagina were prepped.  A Foley catheter was placed, and the patient was draped.  The abdomen was entered through the prior midline incision.  The peritoneal washings were obtained.  The upper abdomen and pelvis were explored, with the above-noted findings.  The Bookwalter retractor was position, and the bowel was packed out f the pelvis.  The left retroperitoneal space was opened, identifying the external and internal iliac artery, and the ureter.  The ovarian vessels were skeletonized after the sigmoid colon was advanced away from the pelvic side wall with sharp nd blunt dissection.  With the vessels skeletonized, they were clamped, cut, and free-tied, and suture ligated.  The sigmoid colon was further mobilized away from its adhesions to the ovarian tumor, and left pelvic side wall.  In order to protect the ureter a uterolysis was performed, mobilizing the ureter on the left laterally, away from the retroperitoneal fibrosis.  Once this was accomplished, the ovary as further mobilized and using sharp and blunt dissection was freed from its attachments to the posterior cul-de-sac and sigmoid colon.  The uterus was grasped with a long Kelly clamp across the cornu, and the fallopian tube and cornu were  again crossclamped with a parametrial clamp and transected, freeing the left tube and ovary from the pelvis, and this was submitted to frozen section, with  the above-noted findings.  The right round ligament was divided and the retroperitoneal space opened.  The right ovarian vessels were skeletonized, clamped, cut, and suture ligated, and free-tied.  The bladder flap was advanced with sharp and blunt dissection.  In order to protect the rectum from the dissection, sharp and blunt dissection were required to free it from its dense adhesions to the posterior aspect of the uterus.  Ultimately the rectovaginal septum was developed, further mobilizing  the rectum away from the back of the uterus and upper cervix.  The uterine vessels were skeletonized, clamped, cut, and suture ligated with #2-0 Vicryl in a stepwise fashion.  The paracervical and Cardinal ligaments were clamped, cut, and suture ligated.  The vaginal angles were crossclamped and divided.  The vagina was transected from its connection to the cervix.  The uterus and cervix and right tube and ovary were handed off the operative field.  The vaginal angles were transfixed and the central portion of the vagina was closed  with interrupted figure-of-eight sutures of #0 Vicryl.  The pelvis was reinspected and hemostasis was achieved with cautery.  The procedure was terminated.  The laparotomy packs and retractors were removed. The anterior abdominal wall was closed in layers, the first being a running Smead-Jones closure using #1 PDS.  The subcutaneous tissue was irrigated, and hemostasis was achieved with cautery.  The skin was closed with skin staples.  dressing was applied.  The patient was awakened from anesthesia and taken to the recovery room in satisfactory condition.  The sponge, needle, and instrument counts were correct x 2. DD:  03/15/00 TD:  03/15/00 Job: [id number]

## 2011-02-11 NOTE — Discharge Summary (Signed)
[facility]  Patient:    [name], [name]                      MRN: [id number] Adm. Date:  [id number] Disc. Date: 03/18/00 Attending:  Jeannette Corpus CC:         GYN/oncology service, [facility]                           Discharge Summary  ADMISSION DIAGNOSIS:          Complex pelvic mass.  DISCHARGE DIAGNOSIS:          Complex pelvic mass, mucinous cystadenoma left ovary, Brunners tumor left ovary.  HISTORY OF PRESENT ILLNESS:   [age]-year-old white female referred by Dr. Jacalyn Lefevre, [facility], for evaluation and management of a newly diagnosed abdominopelvic mass.  The patient noticed a protrusion of her right lower quadrant a couple of weeks prior to admission while [place] the [place].  She saw Dr. Hyacinth Meeker, who obtained an ultrasound and a CT scan.  The CT scan showed a 9 cm x 16 cm x 10 cm cystic and solid pelvic mass with thick septations and mural calcifications.  There was a slight amount of free cul-de-sac fluid.  The upper abdomen, including the liver and kidneys and lymph nodes, were essentially normal.  The patient denied any GI or GU symptoms and denied any pain.  PAST SURGICAL HISTORY:        1. Uterine suspension.                               2. Appendectomy.  PAST MEDICAL HISTORY:         Hypertension.  MEDICATIONS:                  Hydrochlorothiazide 25 mg p.o. daily.  ALLERGIES:                    No known drug allergies.  SOCIAL HISTORY:               The patient is married.  She has one living child.  She smokes approximately one-third of a pack per day.  FAMILY HISTORY:               Negative for gynecologic or breast cancers.  GYNECOLOGIC HISTORY:          Has not had gynecologic examination in several years.  Last mammogram was two years ago, normal.  PHYSICAL EXAMINATION:  GENERAL:                      Slim, elderly, very pleasant white female in no acute  distress.  VITAL SIGNS:                  Height 5 feet 5 inches, weight 111 pounds. Blood pressure 138/76, pulse 78.  HEENT:                        Negative.  NECK:                         Supple, without thyromegaly.  No adenopathy appreciated.  CHEST:  Clear to auscultation and percussion bilaterally.  CARDIAC:                      Normal, without murmurs or rubs.  ABDOMEN:                      Soft, nontender.  Palpable mass extending to near the umbilicus on the right lower abdomen.  PELVIC:                       Normal external female genitalia.  Vagina was clean.  Cervix deviated anteriorly.  Uterus difficult to outline.  This is a large pelvic mass that fills the pelvis and extends to the lower abdomen bilaterally, the higher side being approximately at the level of the umbilicus on the right side.  No inguinal lymph nodes appreciated.  PREOPERATIVE LABORATORY DATA:              Hemoglobin 12.9, hematocrit 38.4, white blood cell count 7500, platelets 321,000.  Comprehensive metabolic panel within normal limits.  The CA 125 was 8.9 u/ml.  IMPRESSION:                   Complex pelvic mass in a menopausal patient. Rule out ovarian cancer.  RECOMMENDATIONS:              A lengthy discussion was made with the patient, her husband, and her sister regarding management approach including exploratory laparotomy, total abdominal hysterectomy, bilateral salpingo-oophorectomy.  If it turns out to be a malignancy, then surgical staging was recommended and would also be performed at the same operation. The risks of surgery including hemorrhage, infection, injury to internal viscera including bowel, bladder, ureter, and vessels, or thromboembolic complications, were discussed.  The patient accepted these risks.  HOSPITAL COURSE:              The patient underwent an exploratory laparotomy, total abdominal hysterectomy and bilateral salpingo-oophorectomy,  left ureterolysis on March 15, 2000, without complications.  Postoperative course was uncomplicated except for a very mild ileus that resolved by postoperative day #3.  She was discharged home on postoperative day #3, in good condition.  DISCHARGE LABORATORY DATA:    Hemoglobin 10.9, hematocrit 32.7.  DISCHARGE MEDICATIONS:        1. Vicodin 1-2 tablets p.o. q.4h. as needed for                                  pain.                               2. Antihypertensive medication taken prior to                                  surgery.  DISCHARGE INSTRUCTIONS:       Routine written instructions were given for diet, activity, postoperative surgical instructions.  FOLLOW-UP:                    Follow-up appointment was given for March 20, 2000, at 11 a.m. at GYN/oncology office at [facility] for removal of staples. DD:  03/18/00 TD:  03/20/00 Job: [id number]

## 2011-02-11 NOTE — Discharge Summary (Signed)
[name], Rachel               ACCOUNT NO.:  [id number]   MEDICAL RECORD NO.:  [id number]          PATIENT TYPE:  INP   LOCATION:  5506                         FACILITY:  [facility]   PHYSICIAN:  Mobolaji B. Bakare, M.D.DATE OF BIRTH:  July 19, 1921   DATE OF ADMISSION:  02/05/2006  DATE OF DISCHARGE:  02/09/2006                                 DISCHARGE SUMMARY   PRIMARY CARE PHYSICIAN:  Dr. Caryl Never at [facility].   FINAL DIAGNOSES:  1.  Atypical chest pain, negative Cardiolite stress test.  2.  Cervical spondylosis with mild-to-moderate spinal stenosis.  3.  Mild hyponatremia.   SECONDARY DIAGNOSES:  1.  Chronic obstructive pulmonary disease.  2.  Tobacco abuse.  3.  Hypertension, controlled.  4.  History of shingles.   PROCEDURES:  1.  Chest x-ray done on Feb 05, 2006, showed COPD.  2.  MRI of the spine done on Feb 06, 2006, showed cervical spondylytic      changes, spinal stenosis, and foraminal narrowing most notable in C5-C6      level and slightly less so at C3-4, C4-5, and C6-7 levels.  The most      significant right-sided facet joint degenerative changes are at the C3-      C4 level where there is associated prominent right-sided C3-4 foraminal      narrowing.  3.  Myocardial perfusion study was normal, done on Feb 08, 2006.   CONSULTATIONS:  Cardiology consult, Dr. Jenne Campus.   BRIEF HISTORY:  [name] is a pleasant 77 year old Caucasian female who  presented to the emergency room with chief complaint of right shoulder pain  radiating to the right forearm and she subsequently developed chest pain  across the chest.  This lasted 2 hours.  There was no associated shortness  of breath, diaphoresis.  She did have cervical neck pain preceding the onset  of right shoulder and right arm pain.  The patient stated that she has been  treated for shoulder pain in the outpatient setting and there was a question  of rotator cuff problem.  She uses Advair for  this.  She claimed that this  pain is quite severe when it started unlike before.  The chest tightness  across her chest was quite brief, the main pain issue she had was the right  shoulder pain.   She was admitted to the hospital for further evaluation.   HOSPITAL COURSE:  1.  [name] was evaluated for presumptive diagnosis of neuropathic pain.      MRI result is as noted above.  She does have some cervical degenerative      disk disease with severe atrophy at C3-C4 level associated with      foraminal narrowing.  It is suspected that this is responsible for her      shoulder pain.  The patient was treated with analgesia, mainly Tylenol      with Vicodin p.r.n.  The pain did not recur during the course of her      hospitalization.She will follow up with Dr. Caryl Never in the outpatient  setting.  Decision can be made if shoulder pain recurs, to follow up      with a neurosurgeon.  2.  Chest tightness.  This was atypical for a myocardial infarction.  The      patient has never had any history of heart disease.  She has low risk      for coronary artery disease.  However, it was noted on EKG that she has      Q waves in inferior leads.  She has never been evaluated by cardiology      before.  It was felt prudent to pursue a cardiac stress test.      Cardiologist evaluated the patient and felt appropriate to go ahead and      do a Cardiolite stress test, which turned out negative.  Lipid profile      was obtained.  LDL was 123, HDL was 112.  It was felt she should be      treated for dyslipidemia.  She was started on Zocor.  3.  History of shingles.  She reported during the course of hospitalization      to have recurrent episodes of shingles break out on her forehead.  She      started having a sensation of similar breakout.  The patient was started      on Zovirax cream topically.  The symptoms resolved.   CONDITION ON DISCHARGE:  The patient was stable, chest pain free at the  time  of discharge.   DISCHARGE MEDICATIONS:  1.  Advair p.r.n.  2.  Aspirin 81 mg daily.  3.  Clonazepam 0.5 mg daily.  4.  Hydrochlorothiazide 25 mg daily.  5.  Vitamin C 500 mg b.i.d.  6.  Calcium with vitamin D 1 tablet b.i.d.  7.  Centrum one daily.  8.  Fish oil __________ nightly.  9.  Zovirax cream apply to forehead 2 times a day for one week.  10. Zocor 20 mg daily.  11. Relafen 500 mg daily p.r.n.  12. Prilosec OTC 1 daily.   FOLLOWUP:  Follow up with Dr. Caryl Never in one to two weeks.      Mobolaji B. Corky Downs, M.D.  Electronically Signed     MBB/MEDQ  D:  02/14/2006  T:  02/14/2006  Job:  [id number]

## 2011-02-11 NOTE — Consult Note (Signed)
[facility]  Patient:    [name], [name]                      MRN: [id number] Proc. Date: 04/26/00 Adm. Date:  [id number] Disc. Date: [id number] Attending:  Jeannette Corpus CC:         Telford Nab, R.N.  Bertram Millard. Hyacinth Meeker, M.D. - [facility]  [phone] [address], [place], [place] [address]   Consultation Report  CHIEF COMPLAINT:  [name] returns for her initial postoperative checkup, having undergone surgery on March 15, 2000, for a complex pelvic mass.  HISTORY OF PRESENT ILLNESS:  She underwent an exploratory laparotomy, a total abdominal hysterectomy, and bilateral salpingo-oophorectomy, and lysis of adhesions.  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  These were both benign conditions.  The patient has had an uncomplicated postoperative course.  She denies any fever or chills,  abdominal pain, or GI, or GU symptoms.  She has a minimal amount of intermittent vaginal spotting.  PHYSICAL EXAMINATION:  VITAL SIGNS:  Weight 108 pounds, blood pressure 140/70.  GENERAL:  The patient is a healthy white female, in no acute distress.  HEENT:  Negative.  NECK:  Supple, without thyromegaly.  NODES:  There is no supraclavicular or inguinal adenopathy.  ABDOMEN:  Soft, nontender.  No mass, organomegaly, ascites, or hernia noted. The midline incision is healing nicely.  PELVIC:  EG, BUS normal.  Vagina is clean.  The cuff is healing, although there is still an area which is granulating in the right upper apex.  BIMANUAL EXAMINATION:  Reveals minimal postoperative induration without any masses or hematomas.  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  The patient has had good postoperative recovery.  PLAN:  The patient is released, to return to full  levels of activity.  She will  abstain from vaginal intercourse for another two weeks, until the vaginal cuff s completely healed.  I would recommend that she be released to the care of her primary physician, Dr. Bertram Millard. Hyacinth Meeker for an annual gynecologic examination. I would certainly be happy to see [name] at any time in the future if necessary. DD:  04/26/00 TD:  04/26/00 Job: [id number]

## 2011-02-11 NOTE — H&P (Signed)
[name], [name]               ACCOUNT NO.:  [id number]   MEDICAL RECORD NO.:  [id number]          PATIENT TYPE:  INP   LOCATION:  1830                         FACILITY:  [facility]   PHYSICIAN:  Della Goo, M.D. DATE OF BIRTH:  [birth year]   DATE OF ADMISSION:  02/05/2006  DATE OF DISCHARGE:                                HISTORY & PHYSICAL   This is an admission to the [facility].   CHIEF COMPLAINT:  Chest pain.   HISTORY OF PRESENT ILLNESS:  This is an 77 year old female with complaints  of substernal chest pain radiating across the chest and into the right arm.  It started 4 a.m. this a.m., lasting two hours.  She described the pain as  being tightness and ranked it at an 8 out of 10.  The pain resolved after  two hours.  The patient denies this pain was associated with exertion.  The  patient reports traveling back from the beach to home in [place] when  pain returned.  The patient was brought to the emergency department,  evaluated, started on oxygen therapy, administered aspirin and labs and  enzymes were ordered.  The first cardiac enzyme profile has been negative.   The patient does report having right shoulder pain for approximately a week.  This pain was described as being soreness.  She denies any trauma or overt  exertion; however, she had been doing increased work around the house the  day before this pain began.  She saw her primary care physician, Dr. Fortunato Curling,  who advised the patient to take Advil therapy and use heat and ice to the  area.  The thought was this was muscular discomfort.   PAST MEDICAL HISTORY:  1.  Hypertension.  2.  Arthritis.  3.  Otherwise, the patient reports being very healthy.   PAST SURGICAL HISTORY:  Status post total abdominal hysterectomy secondary  to malignancy.   MEDICATIONS:  1.  Hydrochlorothiazide 25 mg 1 p.o. q.a.m.  2.  Klonopin 0.5 mg 1 p.o. q.h.s.  3.  Fish oil and calcium with vitamin D 500 mg b.i.d.   ALLERGIES:  MORPHINE which causes a rash.   SOCIAL HISTORY:  The patient does live at home with her husband.  She has no  history of tobacco or alcohol usage.  She does report quitting smoking six  years ago before her hysterectomy surgery.   FAMILY HISTORY:  Positive coronary artery disease in her father and sister.  Positive hypertension in her father.  No history of cancer or diabetes in  her family that she knows of.   PHYSICAL EXAMINATION:  GENERAL:  A pleasant, 77 year old thin, well-  developed and well-nourished female in no acute distress.  Currently  painfree.  VITAL SIGNS:  Temperature 97.3, blood pressure 159/76 to 177/77, heart rate  62 to 70 and respirations 18 to 22.  O2 saturation 98%.  HEENT:  Normocephalic and atraumatic.  Pupils are equal, round, and reactive  to light.  Extraocular movements intact.  Oropharynx is clear.  NECK:  Supple.  Full range of motion.  No thyromegaly.  No jugular venous  distention.  No adenopathy.  CARDIOVASCULAR:  Regular rate and rhythm.  No murmurs, gallops, or rubs  detected.  LUNGS:  Clear to auscultation bilaterally without rales, rhonchi or wheezes.  ABDOMEN:  Positive bowel sounds.  Soft, nontender, and nondistended.  No  hepatosplenomegaly.  EXTREMITIES:  Without edema.  NEUROLOGICAL:  The patient is alert and oriented x3 and there are no focal  deficits.   LABORATORY DATA:  Hemoglobin 11.6, hematocrit 34.0.  Sodium is 131,  potassium 3.5, chloride 97, bicarb 29.4, BUN 11, creatinine 0.9, glucose  106.  Cardiac enzymes #1 CK-MB was 3.1 which is within normal limits and  troponin was less than 0.05.  EKG findings normal sinus rhythm.   ASSESSMENT:  Chest pain.   PLAN:  The patient is being admitted to telemetry area for cardiac  monitoring.  Cardiac enzymes will be continued q.8h. for 24 hours.  She has  been placed on nitrate therapy, nasal cannula oxygen and aspirin therapies.  She has also been written for DVT and MI  prophylaxis with Lovenox q.12h.  along with GI prophylaxis with Protonix.  She will continue on her regular  medications.      Della Goo, M.D.  Electronically Signed     HJ/MEDQ  D:  02/05/2006  T:  02/05/2006  Job:  [id number]

## 2011-02-14 ENCOUNTER — Encounter: Payer: Self-pay | Admitting: Family Medicine

## 2011-02-14 ENCOUNTER — Ambulatory Visit (INDEPENDENT_AMBULATORY_CARE_PROVIDER_SITE_OTHER): Payer: Medicare Other | Admitting: Family Medicine

## 2011-02-14 VITALS — BP 140/70 | Temp 98.3°F | Wt 109.0 lb

## 2011-02-14 DIAGNOSIS — L03119 Cellulitis of unspecified part of limb: Secondary | ICD-10-CM

## 2011-02-14 NOTE — Progress Notes (Signed)
  Subjective:    Patient ID: [name], female    DOB: [birth year], 77 y.o.   MRN: [id number]  HPI Patient seen followup cellulitis right leg. Refer to prior note. Recent procedure per dermatologist. Developed ulcer right anterior leg following cryotherapy.  On Keflex. Compliant with medication. No side effects. Has some swelling and still some warmth but erythema has improved. No fever or chills. Able to ambulate without much difficulty. Overall she feels improved   Review of Systems  Constitutional: Negative for fever and chills.       Objective:   Physical Exam  Constitutional: She is oriented to person, place, and time. She appears well-developed and well-nourished.  Cardiovascular: Normal rate and regular rhythm.   Pulmonary/Chest: Effort normal and breath sounds normal. No respiratory distress. She has no wheezes. She has no rales.  Musculoskeletal: She exhibits edema.       Patient has edema right lower leg. Eschar right anterior leg is healing and is now approximately 1.5 x 0.5 cm. Still has some surrounding warmth and erythema of the less erythema compared with last week.  She does have some edema from the upper leg all way to the feet.  Neurological: She is alert and oriented to person, place, and time.          Assessment & Plan:  Cellulitis right leg slightly improved. Finish out antibiotic. Frequent leg elevation and touch base as needed

## 2011-02-14 NOTE — Patient Instructions (Signed)
Finish out antibiiotic.  Continue to elevate leg frequently.  Be in touch for any fever or worsening redness or pain.

## 2011-02-15 ENCOUNTER — Other Ambulatory Visit: Payer: Self-pay | Admitting: Family Medicine

## 2011-02-22 ENCOUNTER — Telehealth: Payer: Self-pay | Admitting: Family Medicine

## 2011-02-22 NOTE — Telephone Encounter (Signed)
Go ahead and restart and office follow up in no better in one week and sooner if worsening.

## 2011-02-22 NOTE — Telephone Encounter (Signed)
Pt was given Cephalexin for inf in leg. Pt finished med last week, but sore on leg has turned red again. Pt went to doctor in [place], [place] and was given same med for leg, but the doctor there,told pt not to start med until 03/03/11. Pt wants to know if Dr Caryl Never thinks it would be ok to start med now for redness in leg?

## 2011-02-22 NOTE — Telephone Encounter (Signed)
Please advise 

## 2011-02-23 NOTE — Telephone Encounter (Signed)
Pt informed and she voiced her understanding 

## 2011-03-03 ENCOUNTER — Other Ambulatory Visit: Payer: Self-pay | Admitting: Family Medicine

## 2011-03-10 ENCOUNTER — Encounter: Payer: Self-pay | Admitting: Family Medicine

## 2011-03-10 ENCOUNTER — Ambulatory Visit
Admission: RE | Admit: 2011-03-10 | Discharge: 2011-03-10 | Disposition: A | Discharge status: Home / Self Care | Payer: Medicare Other | Source: Ambulatory Visit | Attending: Family Medicine | Admitting: Family Medicine

## 2011-03-10 ENCOUNTER — Other Ambulatory Visit: Payer: Self-pay | Admitting: Family Medicine

## 2011-03-10 ENCOUNTER — Ambulatory Visit (INDEPENDENT_AMBULATORY_CARE_PROVIDER_SITE_OTHER): Payer: Medicare Other | Admitting: Family Medicine

## 2011-03-10 VITALS — BP 132/70 | Temp 98.1°F | Wt 110.0 lb

## 2011-03-10 DIAGNOSIS — R609 Edema, unspecified: Secondary | ICD-10-CM

## 2011-03-10 DIAGNOSIS — I10 Essential (primary) hypertension: Secondary | ICD-10-CM

## 2011-03-10 DIAGNOSIS — R52 Pain, unspecified: Secondary | ICD-10-CM

## 2011-03-10 DIAGNOSIS — J449 Chronic obstructive pulmonary disease, unspecified: Secondary | ICD-10-CM

## 2011-03-10 DIAGNOSIS — R600 Localized edema: Secondary | ICD-10-CM

## 2011-03-10 NOTE — Progress Notes (Signed)
  Subjective:    Patient ID: [name], female    DOB: [birth year], 77 y.o.   MRN: [id number]  HPI Patient seen for medical followup. History of hypertension, mild familial tremor, COPD. Respiratory symptoms improved with Spiriva. No recent cough. Able to walk some for exercise. No albuterol use.  Recent eschar right lower extremity and cellulitis related to dermatologic procedure per dermatologist. She had some edema last visit which we presumed was related to the cellulitis. She now presents with some persistent and possibly worsening edema right lower extremity. No injury. No dyspnea. No pleuritic pain. No hemoptysis. Edema improves at night with elevation. No compression stockings. Takes HCTZ  Denies recent chest pain. No orthostasis   Review of Systems  Constitutional: Negative for fever, chills, activity change and fatigue.  Respiratory: Negative for cough and shortness of breath.   Cardiovascular: Positive for leg swelling. Negative for chest pain and palpitations.  Genitourinary: Negative for dysuria.  Neurological: Negative for dizziness, weakness and headaches.       Objective:   Physical Exam  Constitutional: She is oriented to person, place, and time. She appears well-developed and well-nourished.  HENT:  Head: Normocephalic.  Right Ear: External ear normal.  Left Ear: External ear normal.  Mouth/Throat: Oropharynx is clear and moist.  Neck: Neck supple. No thyromegaly present.  Cardiovascular: Normal rate, regular rhythm and normal heart sounds.   Pulmonary/Chest: Breath sounds normal. No respiratory distress. She has no wheezes. She has no rales.  Musculoskeletal: She exhibits edema.       Asymmetric edema right lower extremity compared to the left. She has eschar right anterior leg which is healing well. No cellulitis changes. Minimally tender right calf diffusely. Good capillary refill right foot  Lymphadenopathy:    She has no cervical adenopathy.  Neurological:  She is alert and oriented to person, place, and time.  Psychiatric: She has a normal mood and affect. Her behavior is normal.          Assessment & Plan:  #1 hypertension stable #2 COPD symptomatically stable and improved with Spiriva  #3 asymmetric edema right lower extremity. Check venous Doppler to rule out DVT

## 2011-03-11 NOTE — Progress Notes (Signed)
Quick Note:  Pt informed ______ 

## 2011-04-08 ENCOUNTER — Telehealth: Payer: Self-pay | Admitting: *Deleted

## 2011-04-08 NOTE — Telephone Encounter (Signed)
Pt's lesion that the dermatologist was following is getting red and looking infected.  Advised pt to call and talk to him.  She saw him one week ago.

## 2011-04-11 NOTE — Telephone Encounter (Signed)
Dr. Caryl Never notified.

## 2011-04-25 ENCOUNTER — Other Ambulatory Visit: Payer: Self-pay | Admitting: Family Medicine

## 2011-04-25 NOTE — Telephone Encounter (Signed)
Refill for 6 months. 

## 2011-04-25 NOTE — Telephone Encounter (Signed)
Last written on 10/11/10 #30 with 5 refills One tab at [facility]

## 2011-04-25 NOTE — Telephone Encounter (Signed)
Faxed refill request for Clonazepam 0.5

## 2011-05-12 ENCOUNTER — Encounter: Payer: Self-pay | Admitting: Family Medicine

## 2011-05-12 ENCOUNTER — Ambulatory Visit (INDEPENDENT_AMBULATORY_CARE_PROVIDER_SITE_OTHER): Payer: Medicare Other | Admitting: Family Medicine

## 2011-05-12 ENCOUNTER — Telehealth: Payer: Self-pay | Admitting: *Deleted

## 2011-05-12 ENCOUNTER — Ambulatory Visit (INDEPENDENT_AMBULATORY_CARE_PROVIDER_SITE_OTHER)
Admission: RE | Admit: 2011-05-12 | Discharge: 2011-05-12 | Disposition: A | Discharge status: Home / Self Care | Payer: Medicare Other | Source: Ambulatory Visit | Attending: Family Medicine | Admitting: Family Medicine

## 2011-05-12 DIAGNOSIS — J449 Chronic obstructive pulmonary disease, unspecified: Secondary | ICD-10-CM

## 2011-05-12 DIAGNOSIS — R0600 Dyspnea, unspecified: Secondary | ICD-10-CM

## 2011-05-12 DIAGNOSIS — L03119 Cellulitis of unspecified part of limb: Secondary | ICD-10-CM

## 2011-05-12 DIAGNOSIS — R0609 Other forms of dyspnea: Secondary | ICD-10-CM

## 2011-05-12 DIAGNOSIS — R0989 Other specified symptoms and signs involving the circulatory and respiratory systems: Secondary | ICD-10-CM

## 2011-05-12 DIAGNOSIS — D649 Anemia, unspecified: Secondary | ICD-10-CM

## 2011-05-12 LAB — CBC WITH DIFFERENTIAL/PLATELET
Basophils Relative: 0.8 % (ref 0.0–3.0)
Eosinophils Absolute: 0.1 10*3/uL (ref 0.0–0.7)
Hemoglobin: 10.8 g/dL — ABNORMAL LOW (ref 12.0–15.0)
Lymphocytes Relative: 8.8 % — ABNORMAL LOW (ref 12.0–46.0)
MCHC: 33.9 g/dL (ref 30.0–36.0)
Neutro Abs: 5.5 10*3/uL (ref 1.4–7.7)
RBC: 3.55 Mil/uL — ABNORMAL LOW (ref 3.87–5.11)

## 2011-05-12 LAB — BRAIN NATRIURETIC PEPTIDE: Pro B Natriuretic peptide (BNP): 71 pg/mL (ref 0.0–100.0)

## 2011-05-12 LAB — BASIC METABOLIC PANEL
BUN: 22 mg/dL (ref 6–23)
CO2: 32 mEq/L (ref 19–32)
Chloride: 93 mEq/L — ABNORMAL LOW (ref 96–112)
Creatinine, Ser: 0.8 mg/dL (ref 0.4–1.2)

## 2011-05-12 MED ORDER — CEPHALEXIN 500 MG PO CAPS: 500.0000 mg | ORAL_CAPSULE | Freq: Three times a day (TID) | ORAL | Status: AC

## 2011-05-12 MED ORDER — ALBUTEROL 90 MCG/ACT IN AERS: 1.0000 | INHALATION_SPRAY | Freq: Four times a day (QID) | RESPIRATORY_TRACT | Status: DC | PRN

## 2011-05-12 NOTE — Patient Instructions (Signed)
Start back home oxygen Elevate legs frequently Start back antibiotic today Followup promptly for a fever or worsening symptoms

## 2011-05-12 NOTE — Progress Notes (Signed)
  Subjective:    Patient ID: [name], female    DOB: October 27, 1920, 77 y.o.   MRN: [id number]  HPI Patient has chronic problems of familial tremor, hypertension, COPD. She is seen with one to two-week history of progressive malaise and increased dyspnea even at rest. No significant cough. Denies fever or chills. She's had recent problems with right leg cellulitis which did improve briefly with antibiotic. Doppler studies revealed no DVT. She denies any dysuria, nausea, or vomiting. Generally feels poorly. Dyspnea does not seem positional. Denies chest pain.  Patient also has some chronic normocytic anemia and has refused further workup. Recent hemoglobin around 10. No active bleeding. Uses Spiriva one inhalation daily. Ran out of albuterol. Previous O2 but none at home at this time.   Review of Systems  Constitutional: Positive for fatigue. Negative for fever and chills.  HENT: Negative for ear pain, congestion and sore throat.   Respiratory: Positive for shortness of breath. Negative for cough and wheezing.   Cardiovascular: Positive for leg swelling. Negative for chest pain and palpitations.  Gastrointestinal: Negative for abdominal pain, blood in stool and abdominal distention.  Genitourinary: Negative for dysuria.  Neurological: Positive for weakness. Negative for dizziness, syncope and headaches.  Psychiatric/Behavioral: Negative for confusion.       Objective:   Physical Exam  Constitutional: She is oriented to person, place, and time. She appears well-developed and well-nourished.  HENT:  Head: Normocephalic and atraumatic.  Mouth/Throat: Oropharynx is clear and moist. No oropharyngeal exudate.  Neck: Neck supple. No thyromegaly present.  Cardiovascular:       Regular but rate around 90-100  Pulmonary/Chest:       Somewhat diminished breath sounds throughout but fairly good breath sounds. She has some bibasilar rales  Abdominal: Soft. There is no tenderness.    Musculoskeletal:       Right leg reveals diffuse erythema warmth and edema compared to the left. No ulcerations.  Foot with good capillary refill  Lymphadenopathy:    She has no cervical adenopathy.  Neurological: She is alert and oriented to person, place, and time.          Assessment & Plan:  #1 recurrent cellulitis right leg. Keflex 500 mg 3 times a day for 10 days and reassess in 4 days. Elevate frequently #2 dyspnea. Possibly related to COPD.   She has new finding of bibasal rales. Check chest x-ray, basic metabolic panel, and BNP. Marland Kitchen Refill albuterol. Get patient back on home oxygen. Offered hospital admission if symptoms worsening. No fever to suggest pneumonia this time #3 chronic COPD. Home oxygen as above. Continue Spiriva and refill albuterol  #4 history of chronic normocytic anemia. Recheck CBC-to make sure worsening anemia not contributing to dyspnea.  She has refused further evaluation of anemia in past.

## 2011-05-12 NOTE — Telephone Encounter (Signed)
Pt here for OV, Dr Caryl Never ordered O2 at 2 liters per nasal canula, stationary and portable units needed for home.  Order faxed to [employer], attn Chalmers Cater at [phone].  Demographics sheet, office note and order faxed, confirmation received.

## 2011-05-13 NOTE — Progress Notes (Signed)
Quick Note:  Pt informed ______ 

## 2011-05-16 ENCOUNTER — Encounter: Payer: Self-pay | Admitting: Family Medicine

## 2011-05-16 ENCOUNTER — Ambulatory Visit (INDEPENDENT_AMBULATORY_CARE_PROVIDER_SITE_OTHER): Payer: Medicare Other | Admitting: Family Medicine

## 2011-05-16 DIAGNOSIS — J449 Chronic obstructive pulmonary disease, unspecified: Secondary | ICD-10-CM

## 2011-05-16 DIAGNOSIS — L03119 Cellulitis of unspecified part of limb: Secondary | ICD-10-CM

## 2011-05-16 DIAGNOSIS — L02419 Cutaneous abscess of limb, unspecified: Secondary | ICD-10-CM

## 2011-05-16 DIAGNOSIS — E871 Hypo-osmolality and hyponatremia: Secondary | ICD-10-CM

## 2011-05-16 MED ORDER — HYDROCODONE-ACETAMINOPHEN 5-500 MG PO CAPS: 1.0000 | ORAL_CAPSULE | Freq: Four times a day (QID) | ORAL | Status: AC | PRN

## 2011-05-16 NOTE — Progress Notes (Signed)
  Subjective:    Patient ID: [name], female    DOB: Mar 14, 1921, 77 y.o.   MRN: [id number]  HPI Followup dyspnea and recurrent cellulitis right leg. Patient started cephalexin 500 mg 3 times a day. Questionable low-grade fever over the weekend with temperature around 100 yesterday. No chills. Compliant with antibiotic. Having significant right leg pain. Took a leftover hydrocodone with good relief.  Increased dyspnea. Known emphysema. Chest x-ray revealed emphysema changes but no acute finding. No visible pneumonia. Patient denies cough. In the process of getting home oxygen. Patient denies chest pain. Hemoglobin stable at 10.8. BNP level less than 100. No evidence for CHF.  Patient is an ex-smoker and quit smoking about 15 years ago.  Recent labs reviewed. Mild hyponatremia with sodium 132. Most likely this is related to her HCTZ therapy.  Past Medical History  Diagnosis Date  . 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 Hypercalcemia 04/29/2010  . INSOMNIA, CHRONIC 04/29/2009  . FAMILIAL TREMOR 10/29/2009  . HYPERTENSION 04/29/2009  . Dysuria 10/29/2009   Past Surgical History  Procedure Date  . Appendectomy   . Abdominal hysterectomy 2001    reports that she quit smoking about 11 years ago. She does not have any smokeless tobacco history on file. Her alcohol and drug histories not on file. family history includes Cancer in her other; Diabetes in her other; Hyperlipidemia in her other; and Hypertension in her other. Allergies  Allergen Reactions  . Amoxicillin     Blotches on legs  . Amoxicillin (Amoxil)       Review of Systems  Constitutional: Positive for fatigue. Negative for chills and appetite change.  Respiratory: Positive for shortness of breath. Negative for cough, chest tightness and wheezing.   Cardiovascular: Negative for chest pain and palpitations.  Gastrointestinal: Negative for abdominal pain.  Genitourinary: Negative for dysuria.  Psychiatric/Behavioral: Negative for  confusion.       Objective:   Physical Exam  Constitutional: She appears well-developed and well-nourished.  HENT:  Mouth/Throat: Oropharynx is clear and moist.  Neck: Neck supple.  Pulmonary/Chest:       Patient has somewhat diminished breath sounds throughout but no retractions. Respiratory rate 14. Previous rales bilaterally not noted today  Musculoskeletal:       Right leg feels erythema and edema as previously noted. Minimal warmth to palpation and tender to palpation. Overall seems slightly less erythematous compared to Thursday  Lymphadenopathy:    She has no cervical adenopathy.          Assessment & Plan:  #1 cellulitis stable continue cephalexin and frequent elevation.  Reassess in one week #2 COPD. Oxygen today 87% room air sitting at rest. Decreased to 77% room air with ambulation and improved to 97% sitting with 2 L O2 nasal cannula.  Proceed with home health acquisition of oxygen #3 mild hyponatremia related to diuretic use

## 2011-05-23 ENCOUNTER — Ambulatory Visit (INDEPENDENT_AMBULATORY_CARE_PROVIDER_SITE_OTHER): Payer: Medicare Other | Admitting: Family Medicine

## 2011-05-23 ENCOUNTER — Encounter: Payer: Self-pay | Admitting: Family Medicine

## 2011-05-23 VITALS — BP 160/80 | Temp 98.4°F | Wt 108.0 lb

## 2011-05-23 DIAGNOSIS — L02419 Cutaneous abscess of limb, unspecified: Secondary | ICD-10-CM

## 2011-05-23 DIAGNOSIS — J449 Chronic obstructive pulmonary disease, unspecified: Secondary | ICD-10-CM

## 2011-05-23 DIAGNOSIS — J4489 Other specified chronic obstructive pulmonary disease: Secondary | ICD-10-CM

## 2011-05-23 DIAGNOSIS — L03119 Cellulitis of unspecified part of limb: Secondary | ICD-10-CM

## 2011-05-23 MED ORDER — CEPHALEXIN 500 MG PO CAPS: 500.0000 mg | ORAL_CAPSULE | Freq: Four times a day (QID) | ORAL | Status: DC

## 2011-05-23 NOTE — Progress Notes (Signed)
  Subjective:    Patient ID: [name], female    DOB: [birth year], 77 y.o.   MRN: [id number]  HPI Patient with COPD. Recent home oxygen. Patient interested in portable type oxygen. She and her husband frequently go to the beach and need more portable. She is wearing this consistently at night and occasionally during the day. This is helping symptomatically. She denies cough. No fever. Still has dyspnea with exertion.  Recent cellulitis right leg. Venous Dopplers- no DVT. Some improvement with Keflex but still some erythema. Denies fever or chills. Ambulating without difficulty. Minimal pain   Review of Systems  Constitutional: Negative for fever and chills.  Respiratory: Positive for shortness of breath. Negative for cough and wheezing.   Cardiovascular: Positive for leg swelling. Negative for chest pain and palpitations.  Genitourinary: Positive for urgency.       Objective:   Physical Exam  Constitutional: She is oriented to person, place, and time. She appears well-developed and well-nourished.  Neck: Neck supple.  Cardiovascular: Normal rate and regular rhythm.   Pulmonary/Chest: Effort normal and breath sounds normal. No respiratory distress. She has no wheezes. She has no rales.  Musculoskeletal:       Right leg - mild edema. Mild erythema right lower leg. Minimal warmth. Minimal tenderness. No ulcerations  Lymphadenopathy:    She has no cervical adenopathy.  Neurological: She is alert and oriented to person, place, and time.          Assessment & Plan:  #1 cellulitis right leg.  Improved but not resolved.  Repeat cephalexin 500 mg 3 times a day for one week. #2 COPD. Order for portable oxygen.  Reminder for flu vaccine. Reassess 2 months

## 2011-06-01 ENCOUNTER — Ambulatory Visit (INDEPENDENT_AMBULATORY_CARE_PROVIDER_SITE_OTHER): Payer: Medicare Other | Admitting: Family Medicine

## 2011-06-01 ENCOUNTER — Encounter: Payer: Self-pay | Admitting: Family Medicine

## 2011-06-01 VITALS — BP 130/70 | Temp 98.6°F | Wt 108.0 lb

## 2011-06-01 DIAGNOSIS — L02419 Cutaneous abscess of limb, unspecified: Secondary | ICD-10-CM

## 2011-06-01 DIAGNOSIS — L03119 Cellulitis of unspecified part of limb: Secondary | ICD-10-CM

## 2011-06-01 MED ORDER — MOXIFLOXACIN HCL 400 MG PO TABS: 400.0000 mg | ORAL_TABLET | Freq: Every day | ORAL | Status: DC

## 2011-06-01 NOTE — Progress Notes (Signed)
  Subjective:    Patient ID: [name], female    DOB: September 19, 1921, 77 y.o.   MRN: [id number]  HPI Recurrent redness and mild discomfort right lower leg. Treated for cellulitis with 2 courses of cephalexin. Each time symptoms seemed improved but not fully resolved. No fever or chills. Denies leg injury. No claudication symptoms. No history of significant peripheral vascular disease. No history of diabetes. Denies any recent side effects from antibiotics such as diarrhea. Reported allergy to amoxicillin.   Review of Systems  Constitutional: Negative for fever and chills.  Respiratory: Negative for shortness of breath.   Cardiovascular: Negative for leg swelling.  Musculoskeletal: Negative for gait problem.       Objective:   Physical Exam  Constitutional: She appears well-developed and well-nourished.  Cardiovascular: Normal rate and regular rhythm.   Pulmonary/Chest: Effort normal and breath sounds normal. No respiratory distress. She has no wheezes. She has no rales.  Musculoskeletal:       Right leg reveals some erythema distal third to one half. Not warm to touch. Minimally tender. No edema. No breakthrough scan. Foot is warm to touch with good dorsalis pedis pulse. Good capillary refill.          Assessment & Plan:  Recurrent cellulitis right leg. Avelox 400 mg daily for 10 days. Continue to elevate frequently. Touch base if not resolving after antibiotics complete

## 2011-06-01 NOTE — Patient Instructions (Signed)
Be in touch in one week if no better.  

## 2011-06-03 ENCOUNTER — Telehealth: Payer: Self-pay | Admitting: *Deleted

## 2011-06-03 MED ORDER — CEPHALEXIN 500 MG PO CAPS: 500.0000 mg | ORAL_CAPSULE | Freq: Four times a day (QID) | ORAL | Status: AC

## 2011-06-03 NOTE — Telephone Encounter (Signed)
Stop Avelox.  Let's start back cephalexin but have her take 500mg  QID and if cellulitis not resolving in 2 weeks will consider ID consult

## 2011-06-03 NOTE — Telephone Encounter (Signed)
Notified pt. 

## 2011-06-03 NOTE — Telephone Encounter (Signed)
Pt is having weakness, and dizziness from the Avelox, and would like to know what Dr. Caryl Never would like her to do.

## 2011-07-11 ENCOUNTER — Encounter: Payer: Self-pay | Admitting: Family Medicine

## 2011-07-11 ENCOUNTER — Ambulatory Visit (INDEPENDENT_AMBULATORY_CARE_PROVIDER_SITE_OTHER): Payer: Medicare Other | Admitting: Family Medicine

## 2011-07-11 VITALS — BP 140/70 | Temp 98.0°F | Wt 108.0 lb

## 2011-07-11 DIAGNOSIS — G5790 Unspecified mononeuropathy of unspecified lower limb: Secondary | ICD-10-CM

## 2011-07-11 DIAGNOSIS — J449 Chronic obstructive pulmonary disease, unspecified: Secondary | ICD-10-CM

## 2011-07-11 MED ORDER — GABAPENTIN 100 MG PO CAPS: 100.0000 mg | ORAL_CAPSULE | Freq: Every day | ORAL | Status: DC

## 2011-07-11 NOTE — Progress Notes (Signed)
  Subjective:    Patient ID: [name], female    DOB: 1921-01-17, 77 y.o.   MRN: [id number]  HPI Patient seen with bilateral leg pain. Mostly at night. No claudication. Throbbing type discomfort which disturbs sleep. Occasional hydrocodone with some relief. No significant low back pain. No urine or stool incontinence. No recent lower extremity weakness. Right lower extremity pain greater than left. She has taken Tylenol without much improvement.  COPD remains stable. Patient has received flu vaccine. Using oxygen just at night.  No dyspnea at rest and minimal with activity.  No chronic cough.  Past Medical History  Diagnosis Date  . 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 Hypercalcemia 04/29/2010  . INSOMNIA, CHRONIC 04/29/2009  . FAMILIAL TREMOR 10/29/2009  . HYPERTENSION 04/29/2009  . Dysuria 10/29/2009   Past Surgical History  Procedure Date  . Appendectomy   . Abdominal hysterectomy 2001    reports that she quit smoking about 11 years ago. She does not have any smokeless tobacco history on file. Her alcohol and drug histories not on file. family history includes Cancer in her other; Diabetes in her other; Hyperlipidemia in her other; and Hypertension in her other. Allergies  Allergen Reactions  . Amoxicillin     Blotches on legs  . Amoxicillin (Amoxil)       Review of Systems  Constitutional: Negative for fever and chills.  HENT: Negative for trouble swallowing and voice change.   Cardiovascular: Negative for chest pain.  Gastrointestinal: Negative for abdominal pain.  Genitourinary: Negative for dysuria, frequency and hematuria.  Skin: Negative for rash.  Neurological: Negative for syncope and weakness.  Psychiatric/Behavioral: Negative for dysphoric mood.       Objective:   Physical Exam  Constitutional: She appears well-developed and well-nourished.  Cardiovascular: Normal rate, regular rhythm and normal heart sounds.   Pulmonary/Chest: Effort normal.       Somewhat  diminished breath sounds throughout but clear. No wheezes or rales.  Musculoskeletal:       Trace edema right lower extremity which is chronic. Erythema from last visit has resolved.  Both feet warm to touch. Good capillary refill.  Neurological:       No focal strength deficits lower extremities. Symmetric lower extremity reflexes          Assessment & Plan:  Neuropathic type pain lower extremities. No evidence for peripheral vascular disease or claudication. Trial low dose Neurontin 100 mg at night and titrate to 2 tablets if no relief after 3-4 nights. Reassess in 6 weeks. COPD.  Stable.  Continue with current meds and night use of O2.

## 2011-07-11 NOTE — Patient Instructions (Signed)
Start gabapentin one at night and after 3 nights if no relief increase two at night

## 2011-08-17 ENCOUNTER — Encounter: Payer: Self-pay | Admitting: Family Medicine

## 2011-08-22 ENCOUNTER — Ambulatory Visit (INDEPENDENT_AMBULATORY_CARE_PROVIDER_SITE_OTHER): Payer: Medicare Other | Admitting: Family Medicine

## 2011-08-22 ENCOUNTER — Encounter: Payer: Self-pay | Admitting: Family Medicine

## 2011-08-22 VITALS — BP 110/70 | Temp 97.8°F | Wt 111.0 lb

## 2011-08-22 DIAGNOSIS — B0229 Other postherpetic nervous system involvement: Secondary | ICD-10-CM

## 2011-08-22 DIAGNOSIS — M792 Neuralgia and neuritis, unspecified: Secondary | ICD-10-CM

## 2011-08-22 DIAGNOSIS — G5790 Unspecified mononeuropathy of unspecified lower limb: Secondary | ICD-10-CM

## 2011-08-22 MED ORDER — GABAPENTIN 100 MG PO CAPS: 100.0000 mg | ORAL_CAPSULE | Freq: Every day | ORAL | Status: DC

## 2011-08-22 NOTE — Progress Notes (Signed)
  Subjective:    Patient ID: [name], female    DOB: September 26, 1921, 77 y.o.   MRN: [id number]  HPI  Followup leg pain. Refer to prior note. We suspected neuropathic pain. She has not had any claudication type symptoms. We started gabapentin 100 mg nightly and titrated 200 mg at night and she is seeing about 60% improvement which is allowing her to sleep at night. She is very pleased with her results. She has not had any increased edema and no other side effects. She has been reluctant to use for daytime use because of fear of sedation. Overall feels stable. No recent exacerbation respiratory problems. Trying to get oxygen set up for her beach house for when necessary use. She sleeps with oxygen at night but not using her the day.  Review of Systems  Constitutional: Negative for fever and chills.  Respiratory: Negative for cough and wheezing.   Cardiovascular: Negative for chest pain.  Musculoskeletal: Negative for back pain.  Neurological: Negative for weakness.       Objective:   Physical Exam  Constitutional: She is oriented to person, place, and time. She appears well-developed and well-nourished. No distress.  HENT:  Mouth/Throat: Oropharynx is clear and moist.  Cardiovascular: Normal rate and regular rhythm.   Pulmonary/Chest:       Diminished breath sounds throughout. No wheezes.  Musculoskeletal:       Trace pitting edema legs bilaterally  Neurological: She is alert and oriented to person, place, and time.  Psychiatric: She has a normal mood and affect. Her behavior is normal.          Assessment & Plan:  Leg pain. Suspected neuropathic. Great improvement with low-dose Neurontin. Discussed possible titration but patient wishes to leave at 200 mg at night. Routine followup 6 months

## 2011-10-26 ENCOUNTER — Other Ambulatory Visit: Payer: Self-pay | Admitting: Family Medicine

## 2011-10-27 ENCOUNTER — Other Ambulatory Visit: Payer: Self-pay | Admitting: *Deleted

## 2011-10-27 NOTE — Telephone Encounter (Signed)
Last filled 04-25-11, #30 with 5 refills.

## 2011-10-27 NOTE — Telephone Encounter (Signed)
Clonazepam refill request, one tab at HS, last filled #30 with 5 refills

## 2011-10-27 NOTE — Telephone Encounter (Signed)
Refill for 6 months. 

## 2011-10-28 MED ORDER — CLONAZEPAM 0.5 MG PO TABS: 0.5000 mg | ORAL_TABLET | Freq: Every evening | ORAL | Status: DC | PRN

## 2011-11-16 ENCOUNTER — Other Ambulatory Visit: Payer: Self-pay | Admitting: Family Medicine

## 2011-12-12 ENCOUNTER — Other Ambulatory Visit: Payer: Self-pay | Admitting: Family Medicine

## 2012-02-22 ENCOUNTER — Ambulatory Visit (INDEPENDENT_AMBULATORY_CARE_PROVIDER_SITE_OTHER): Payer: Medicare Other | Admitting: Family Medicine

## 2012-02-22 ENCOUNTER — Encounter: Payer: Self-pay | Admitting: Family Medicine

## 2012-02-22 VITALS — BP 130/60 | HR 86 | Temp 98.1°F | Resp 16 | Wt 109.5 lb

## 2012-02-22 DIAGNOSIS — I10 Essential (primary) hypertension: Secondary | ICD-10-CM

## 2012-02-22 DIAGNOSIS — D649 Anemia, unspecified: Secondary | ICD-10-CM

## 2012-02-22 DIAGNOSIS — J4489 Other specified chronic obstructive pulmonary disease: Secondary | ICD-10-CM

## 2012-02-22 DIAGNOSIS — B0229 Other postherpetic nervous system involvement: Secondary | ICD-10-CM

## 2012-02-22 DIAGNOSIS — J449 Chronic obstructive pulmonary disease, unspecified: Secondary | ICD-10-CM

## 2012-02-22 LAB — CBC WITH DIFFERENTIAL/PLATELET
Basophils Absolute: 0 10*3/uL (ref 0.0–0.1)
HCT: 32.9 % — ABNORMAL LOW (ref 36.0–46.0)
Lymphs Abs: 1.2 10*3/uL (ref 0.7–4.0)
Monocytes Absolute: 0.6 10*3/uL (ref 0.1–1.0)
Monocytes Relative: 10.4 % (ref 3.0–12.0)
Neutrophils Relative %: 66.3 % (ref 43.0–77.0)
Platelets: 237 10*3/uL (ref 150.0–400.0)
RDW: 14.4 % (ref 11.5–14.6)

## 2012-02-22 LAB — BASIC METABOLIC PANEL
CO2: 34 mEq/L — ABNORMAL HIGH (ref 19–32)
Calcium: 10 mg/dL (ref 8.4–10.5)
GFR: 82.39 mL/min (ref >60.00)
Sodium: 133 mEq/L — ABNORMAL LOW (ref 135–145)

## 2012-02-22 NOTE — Progress Notes (Signed)
  Subjective:    Patient ID: [name], female    DOB: [birth year], 78 y.o.   MRN: [id number]  HPI  Medical followup. Patient's history of hypertension, COPD, anemia. She currently is home O2 every night at about 6 hours during the day. She feels very tied down the cause of her oxygen use and is requesting consideration for battery power portable oxygen. Her current home health come does not provide this. Medications reviewed. She remains on Klonopin and which she has taken for several years for chronic insomnia. Steady gait. No recent falls. Takes HCTZ for hypertension. Blood pressure well controlled. No orthostasis.  Had shingles last year has some intermittent postherpetic neuralgia involving left side of face. Pain is relatively mild. Patient has history of anemia and has refused further workup-in terms of endoscopy  Past Medical History  Diagnosis Date  . 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 Hypercalcemia 04/29/2010  . INSOMNIA, CHRONIC 04/29/2009  . FAMILIAL TREMOR 10/29/2009  . HYPERTENSION 04/29/2009  . Dysuria 10/29/2009   Past Surgical History  Procedure Date  . Appendectomy   . Abdominal hysterectomy 2001    reports that she quit smoking about 12 years ago. She does not have any smokeless tobacco history on file. Her alcohol and drug histories not on file. family history includes Cancer in her other; Diabetes in her other; Hyperlipidemia in her other; and Hypertension in her other. Allergies  Allergen Reactions  . Amoxicillin     Blotches on legs  . Amoxicillin (Amoxicillin)       Review of Systems  Constitutional: Negative for appetite change and unexpected weight change.  Respiratory: Positive for shortness of breath (Chronic and unchanged). Negative for cough and wheezing.   Gastrointestinal: Negative for abdominal pain and blood in stool.  Genitourinary: Negative for dysuria and hematuria.  Neurological: Negative for dizziness and syncope.  Hematological: Does not  bruise/bleed easily.  Psychiatric/Behavioral: Negative for confusion.       Objective:   Physical Exam  Constitutional: She is oriented to person, place, and time. She appears well-developed and well-nourished.  HENT:  Mouth/Throat: Oropharynx is clear and moist.  Neck: Neck supple. No thyromegaly present.  Cardiovascular: Normal rate and regular rhythm.   Pulmonary/Chest: Effort normal and breath sounds normal. No respiratory distress. She has no wheezes. She has no rales.  Musculoskeletal: She exhibits no edema.  Lymphadenopathy:    She has no cervical adenopathy.  Neurological: She is alert and oriented to person, place, and time.          Assessment & Plan:  #1 COPD. Continue Spiriva. We'll check into prescription for battery powered portable oxygen unit which will allow her increased mobility #2 history of mild hyponatremia. Recheck basic metabolic panel #3 history of anemia. Recheck CBC #4 hypertension stable continue HCTZ

## 2012-02-27 ENCOUNTER — Telehealth: Payer: Self-pay | Admitting: Family Medicine

## 2012-02-27 NOTE — Telephone Encounter (Signed)
A certificate of medical necessity was faxed back to our office on behalf of this patient.  However, it did not print out with Medicare allowable on the form.  Wants to know if md would write in medicare allowable in on the form, initial, date and refax to [place].

## 2012-03-01 NOTE — Telephone Encounter (Signed)
Form completed, medical notes faxed.

## 2012-04-12 ENCOUNTER — Other Ambulatory Visit: Payer: Self-pay | Admitting: Family Medicine

## 2012-04-21 ENCOUNTER — Other Ambulatory Visit: Payer: Self-pay | Admitting: Family Medicine

## 2012-04-23 NOTE — Telephone Encounter (Signed)
Last seen 02/22/12 6 mos rov  Last written 04/25/11 # 30 5Rf Please advise

## 2012-04-24 NOTE — Telephone Encounter (Signed)
Refill for 6 months. 

## 2012-04-24 NOTE — Telephone Encounter (Signed)
Klonopin  Last filled 11-09-11, #30 with 5 refills

## 2012-04-25 NOTE — Telephone Encounter (Signed)
Rx called in 

## 2012-04-25 NOTE — Telephone Encounter (Signed)
Pt called req status of Klonopin. Please call in to [facility] [phone]. Pt is completely out of med. Pt said that she called this pharmacy today and nothing has been rcvd yet.

## 2012-05-07 ENCOUNTER — Telehealth: Payer: Self-pay | Admitting: Family Medicine

## 2012-05-07 NOTE — Telephone Encounter (Signed)
Pt called and is needing Dr Caryl Never to write a note to [facility] that it is medically necessary for pt to be on Oxygen Tank from [employer]. Pts insurance will not cover without note from Doctor.

## 2012-05-09 NOTE — Telephone Encounter (Signed)
Pt called back to check on status of getting a written note sent to [facility], that is is Medically Necessary for pt to get an Oxygen Tank from [place]. Pt says that this has be done before 05/20/12. Pls notify pt when done.

## 2012-05-09 NOTE — Telephone Encounter (Signed)
Called pt and told her that we needed more info re: letter pt needs. Pt said that she had rcvd a letter from [place] that said that she would need to obtain a written letter from her pcp in order for mcr to cover the oxygen tank for re-certification.  Pt said that the letter would need to be mailed To [employer] : [address] [place], [place] [address], and this needed to be done before 05/20/12.

## 2012-05-09 NOTE — Telephone Encounter (Signed)
OK to produce letter as requested.

## 2012-05-10 ENCOUNTER — Encounter: Payer: Self-pay | Admitting: *Deleted

## 2012-05-10 NOTE — Telephone Encounter (Signed)
Letter mailed

## 2012-08-24 ENCOUNTER — Encounter: Payer: Self-pay | Admitting: Family Medicine

## 2012-08-24 ENCOUNTER — Ambulatory Visit (INDEPENDENT_AMBULATORY_CARE_PROVIDER_SITE_OTHER): Payer: Medicare Other | Admitting: Family Medicine

## 2012-08-24 VITALS — BP 118/64 | HR 73 | Temp 97.9°F | Wt 110.0 lb

## 2012-08-24 DIAGNOSIS — G4700 Insomnia, unspecified: Secondary | ICD-10-CM

## 2012-08-24 DIAGNOSIS — J449 Chronic obstructive pulmonary disease, unspecified: Secondary | ICD-10-CM

## 2012-08-24 DIAGNOSIS — G2581 Restless legs syndrome: Secondary | ICD-10-CM

## 2012-08-24 DIAGNOSIS — J4489 Other specified chronic obstructive pulmonary disease: Secondary | ICD-10-CM

## 2012-08-24 DIAGNOSIS — I10 Essential (primary) hypertension: Secondary | ICD-10-CM

## 2012-08-24 NOTE — Patient Instructions (Addendum)
Restless Legs Syndrome Restless legs syndrome is a movement disorder. It may also be called a sensori-motor disorder.  CAUSES  No one knows what specifically causes restless legs syndrome, but it tends to run in families. It is also more common in people with low iron, in pregnancy, in people who need dialysis, and those with nerve damage (neuropathy).Some medications may make restless legs syndrome worse.Those medications include drugs to treat high blood pressure, some heart conditions, nausea, colds, allergies, and depression. SYMPTOMS Symptoms include uncomfortable sensations in the legs. These leg sensations are worse during periods of inactivity or rest. They are also worse while sitting or lying down. Individuals that have the disorder describe sensations in the legs that feel like:  Pulling.  Drawing.  Crawling.  Worming.  Boring.  Tingling.  Pins and needles.  Prickling.  Pain. The sensations are usually accompanied by an overwhelming urge to move the legs. Sudden muscle jerks may also occur. Movement provides temporary relief from the discomfort. In rare cases, the arms may also be affected. Symptoms may interfere with going to sleep (sleep onset insomnia). Restless legs syndrome may also be related to periodic limb movement disorder (PLMD). PLMD is another more common motor disorder. It also causes interrupted sleep. The symptoms from PLMD usually occur most often when you are awake. TREATMENT  Treatment for restless legs syndrome is symptomatic. This means that the symptoms are treated.   Massage and cold compresses may provide temporary relief.  Walk, stretch, or take a cold or hot bath.  Get regular exercise and a good night's sleep.  Avoid caffeine, alcohol, nicotine, and medications that can make it worse.  Do activities that provide mental stimulation like discussions, needlework, and video games. These may be helpful if you are not able to walk or  stretch. Some medications are effective in relieving the symptoms. However, many of these medications have side effects. Ask your caregiver about medications that may help your symptoms. Correcting iron deficiency may improve symptoms for some patients. Document Released: 09/02/2002 Document Revised: 12/05/2011 Document Reviewed: 12/09/2010 [employer] Patient Information 2013 [employer].  

## 2012-08-24 NOTE — Progress Notes (Signed)
  Subjective:    Patient ID: [name], female    DOB: 08/22/1921, 78 y.o.   MRN: [id number]  HPI  Medical followup. Patient has history of chronic insomnia, hypertension, familial tremor,, COPD, and chronic normocytic anemia. She has refused further evaluation of her anemia. She uses oxygen at night and her COPD is stable. Remains on Spiriva one inhalation daily.  She is describing frequent discomfort in lower legs. She states symptoms are worse at night and has symptoms lower leg pain at rest and sometimes sensation of crawling or drawing of the legs which is relieved with activity such as walking. She has been chronically on Klonopin 0.5 mg at night also takes low-dose gabapentin 100 mg 2 at night. She is reluctant to take other medications at this time.  Flu vaccine already given at her local pharmacy. No recent chest pains. No recent falls. Mood is stable.  Past Medical History  Diagnosis Date  . 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 Hypercalcemia 04/29/2010  . INSOMNIA, CHRONIC 04/29/2009  . FAMILIAL TREMOR 10/29/2009  . HYPERTENSION 04/29/2009  . Dysuria 10/29/2009   Past Surgical History  Procedure Date  . Appendectomy   . Abdominal hysterectomy 2001    reports that she quit smoking about 12 years ago. She does not have any smokeless tobacco history on file. Her alcohol and drug histories not on file. family history includes Cancer in her other; Diabetes in her other; Hyperlipidemia in her other; and Hypertension in her other. Allergies  Allergen Reactions  . Amoxicillin     Blotches on legs  . Amoxicillin (Amoxicillin)       Review of Systems  Constitutional: Negative for fatigue.  Respiratory: Negative for cough, chest tightness, shortness of breath and wheezing.   Cardiovascular: Negative for chest pain, palpitations and leg swelling.  Gastrointestinal: Negative for abdominal pain.  Genitourinary: Negative for dysuria.  Skin: Negative for rash.  Neurological: Negative for  dizziness, seizures, syncope, weakness, light-headedness and headaches.  Psychiatric/Behavioral: Negative for confusion and dysphoric mood.       Objective:   Physical Exam  Constitutional: She is oriented to person, place, and time. She appears well-developed and well-nourished.  Neck: Neck supple.  Cardiovascular: Normal rate and regular rhythm.   Pulmonary/Chest: Effort normal and breath sounds normal. No respiratory distress. She has no wheezes. She has no rales.  Musculoskeletal: She exhibits no edema.  Neurological: She is alert and oriented to person, place, and time.          Assessment & Plan:  #1 hypertension. Stable. Continue HCTZ. Continue potassium supplementation #2 COPD. Stable. Flu vaccine already given. Continue Spiriva one inhalation daily. Continue nighttime use of oxygen #3 probable restless leg syndrome. We discussed options such as Mirapex.  At this point she wishes to wait. Minimal caffeine use. We also discussed possible titration of her gabapentin up to 300 mg at night and at this point she is reluctant to make changes.

## 2012-09-11 ENCOUNTER — Other Ambulatory Visit: Payer: Self-pay | Admitting: Family Medicine

## 2012-10-17 ENCOUNTER — Other Ambulatory Visit: Payer: Self-pay | Admitting: Family Medicine

## 2012-10-17 NOTE — Telephone Encounter (Signed)
Clonazepam last filled 04-21-12, #30 with 5 refills

## 2012-10-17 NOTE — Telephone Encounter (Signed)
Refill for 6 months. 

## 2012-11-06 ENCOUNTER — Other Ambulatory Visit: Payer: Self-pay | Admitting: Family Medicine

## 2013-02-20 ENCOUNTER — Encounter: Payer: Self-pay | Admitting: Family Medicine

## 2013-02-20 ENCOUNTER — Ambulatory Visit (INDEPENDENT_AMBULATORY_CARE_PROVIDER_SITE_OTHER): Payer: Medicare Other | Admitting: Family Medicine

## 2013-02-20 VITALS — BP 140/70 | Temp 98.6°F | Wt 113.0 lb

## 2013-02-20 DIAGNOSIS — H6120 Impacted cerumen, unspecified ear: Secondary | ICD-10-CM

## 2013-02-20 DIAGNOSIS — G4700 Insomnia, unspecified: Secondary | ICD-10-CM

## 2013-02-20 DIAGNOSIS — H6123 Impacted cerumen, bilateral: Secondary | ICD-10-CM

## 2013-02-20 DIAGNOSIS — I10 Essential (primary) hypertension: Secondary | ICD-10-CM

## 2013-02-20 NOTE — Progress Notes (Signed)
  Subjective:    Patient ID: [name], female    DOB: June 26, 1921, 79 y.o.   MRN: [id number]  HPI Medical followup Recent issues decreased hearing left ear greater than right. Concerned about possible cerumen impaction. She's had this previously in the past. Denies any vertigo.  Hypertension treated with HCTZ. No chest pains or dizziness. Compliant with therapy.  History of chronic insomnia and probable restless leg syndrome. Takes combination of low dose clonazepam and low-dose gabapentin which seems to be working well. No recent falls.  COPD and takes Spiriva. Respiratory symptoms stable.  Past Medical History  Diagnosis Date  . 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 Hypercalcemia 04/29/2010  . INSOMNIA, CHRONIC 04/29/2009  . FAMILIAL TREMOR 10/29/2009  . HYPERTENSION 04/29/2009  . Dysuria 10/29/2009   Past Surgical History  Procedure Laterality Date  . Appendectomy    . Abdominal hysterectomy  2001    reports that she quit smoking about 13 years ago. She does not have any smokeless tobacco history on file. Her alcohol and drug histories are not on file. family history includes Cancer in her other; Diabetes in her other; Hyperlipidemia in her other; and Hypertension in her other. Allergies  Allergen Reactions  . Amoxicillin     Blotches on legs  . Amoxicillin (Amoxicillin)       Review of Systems  Constitutional: Negative for fever, chills, appetite change, fatigue and unexpected weight change.  HENT: Positive for hearing loss.   Eyes: Negative for visual disturbance.  Respiratory: Positive for shortness of breath (Chronic and unchanged). Negative for cough, chest tightness and wheezing.   Cardiovascular: Negative for chest pain, palpitations and leg swelling.  Neurological: Negative for dizziness, seizures, syncope, weakness, light-headedness and headaches.       Objective:   Physical Exam  Constitutional: She appears well-developed and well-nourished.  HENT:   Mouth/Throat: Oropharynx is clear and moist.  Cerumen impactions bilaterally  Neck: Neck supple. No thyromegaly present.  Cardiovascular: Normal rate and regular rhythm.   Pulmonary/Chest: Effort normal.  She has some faint expiratory wheezes. No retractions  Musculoskeletal: She exhibits no edema.  Lymphadenopathy:    She has no cervical adenopathy.  Neurological: She is alert.          Assessment & Plan:  #1 hypertension. Stable. Check basic metabolic panel #2 bilateral cerumen impactions. Irrigation #3 history of chronic insomnia. Continue low-dose clonazepam. #4 COPD. Symptomatically stable. Continue Spiriva. She has albuterol for as needed use

## 2013-04-08 ENCOUNTER — Other Ambulatory Visit: Payer: Self-pay | Admitting: Family Medicine

## 2013-04-08 NOTE — Telephone Encounter (Signed)
Need approval on Klonopin

## 2013-04-09 NOTE — Telephone Encounter (Signed)
Refill both for 6 months. 

## 2013-05-14 ENCOUNTER — Other Ambulatory Visit: Payer: Self-pay | Admitting: Family Medicine

## 2013-08-28 ENCOUNTER — Ambulatory Visit (INDEPENDENT_AMBULATORY_CARE_PROVIDER_SITE_OTHER): Payer: Medicare Other | Admitting: Family Medicine

## 2013-08-28 ENCOUNTER — Encounter: Payer: Self-pay | Admitting: Family Medicine

## 2013-08-28 VITALS — BP 132/70 | HR 79 | Temp 97.6°F | Wt 115.0 lb

## 2013-08-28 DIAGNOSIS — J449 Chronic obstructive pulmonary disease, unspecified: Secondary | ICD-10-CM

## 2013-08-28 DIAGNOSIS — R300 Dysuria: Secondary | ICD-10-CM

## 2013-08-28 DIAGNOSIS — G4700 Insomnia, unspecified: Secondary | ICD-10-CM

## 2013-08-28 DIAGNOSIS — I10 Essential (primary) hypertension: Secondary | ICD-10-CM

## 2013-08-28 DIAGNOSIS — N390 Urinary tract infection, site not specified: Secondary | ICD-10-CM

## 2013-08-28 DIAGNOSIS — D649 Anemia, unspecified: Secondary | ICD-10-CM

## 2013-08-28 LAB — CBC WITH DIFFERENTIAL/PLATELET
Basophils Relative: 0.9 % (ref 0.0–3.0)
Eosinophils Relative: 1.9 % (ref 0.0–5.0)
HCT: 32.7 % — ABNORMAL LOW (ref 36.0–46.0)
Hemoglobin: 11 g/dL — ABNORMAL LOW (ref 12.0–15.0)
Lymphocytes Relative: 16 % (ref 12.0–46.0)
Lymphs Abs: 1 10*3/uL (ref 0.7–4.0)
Monocytes Relative: 8.5 % (ref 3.0–12.0)
Neutro Abs: 4.3 10*3/uL (ref 1.4–7.7)
RBC: 3.63 Mil/uL — ABNORMAL LOW (ref 3.87–5.11)

## 2013-08-28 LAB — POCT URINALYSIS DIPSTICK
Bilirubin, UA: NEGATIVE
Glucose, UA: NEGATIVE
Nitrite, UA: NEGATIVE
Protein, UA: NEGATIVE
Spec Grav, UA: 1.015
Urobilinogen, UA: 0.2

## 2013-08-28 LAB — BASIC METABOLIC PANEL
Calcium: 10 mg/dL (ref 8.4–10.5)
GFR: 74.77 mL/min (ref >60.00)
Glucose, Bld: 100 mg/dL — ABNORMAL HIGH (ref 70–99)
Potassium: 4.2 mEq/L (ref 3.5–5.1)
Sodium: 133 mEq/L — ABNORMAL LOW (ref 135–145)

## 2013-08-28 MED ORDER — CIPROFLOXACIN HCL 250 MG PO TABS: 250.0000 mg | ORAL_TABLET | Freq: Two times a day (BID) | ORAL | Status: DC

## 2013-08-28 NOTE — Progress Notes (Signed)
   Subjective:    Patient ID: [name], female    DOB: [birth year], 79 y.o.   MRN: [id number]  HPI Patient seen for medical follow up. She is new problem of urinary burning and mild frequency one day duration. Denies any back pain. No nausea or vomiting. No fevers or chills. No recent UTI. Allergy to amoxicillin  Chronic problems include history of chronic insomnia, hypertension, familial tremor, COPD, and chronic normocytic anemia. She has refused workup of her anemia in the past. Denies any recent orthostasis. She's become less physically active but no recent falls. No chest pains. No orthostasis. She takes HCTZ for hypertension. She remains on Spiriva for her COPD  Past Medical History  Diagnosis Date  . 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 Hypercalcemia 04/29/2010  . INSOMNIA, CHRONIC 04/29/2009  . FAMILIAL TREMOR 10/29/2009  . HYPERTENSION 04/29/2009  . Dysuria 10/29/2009   Past Surgical History  Procedure Laterality Date  . Appendectomy    . Abdominal hysterectomy  2001    reports that she quit smoking about 13 years ago. She does not have any smokeless tobacco history on file. Her alcohol and drug histories are not on file. family history includes Cancer in her other; Diabetes in her other; Hyperlipidemia in her other; Hypertension in her other. Allergies  Allergen Reactions  . Amoxicillin     Blotches on legs  . Amoxicillin [Amoxicillin]       Review of Systems  Constitutional: Negative for fever, chills, appetite change and fatigue.  Respiratory: Negative for cough, chest tightness, shortness of breath and wheezing.   Cardiovascular: Negative for chest pain, palpitations and leg swelling.  Gastrointestinal: Negative for nausea, vomiting, abdominal pain, diarrhea and constipation.  Genitourinary: Positive for dysuria and frequency.  Musculoskeletal: Negative for back pain.  Neurological: Negative for dizziness, seizures, syncope, weakness, light-headedness and headaches.        Objective:   Physical Exam  Constitutional: She is oriented to person, place, and time. She appears well-developed and well-nourished.  Neck: Neck supple. No thyromegaly present.  Cardiovascular: Normal rate.   Pulmonary/Chest: Effort normal and breath sounds normal. No respiratory distress. She has no wheezes. She has no rales.  Musculoskeletal: She exhibits no edema.  Neurological: She is alert and oriented to person, place, and time.          Assessment & Plan:  #1 UTI. Urine culture sent. Cipro 250 mg twice daily for 7 days #2 hypertension stable at goal. Check basic metabolic panel #3 history of normocytic anemia. Check CBC

## 2013-08-28 NOTE — Progress Notes (Signed)
Pre visit review using our clinic review tool, if applicable. No additional management support is needed unless otherwise documented below in the visit note. 

## 2013-08-28 NOTE — Patient Instructions (Signed)
Urinary Tract Infection  Urinary tract infections (UTIs) can develop anywhere along your urinary tract. Your urinary tract is your body's drainage system for removing wastes and extra water. Your urinary tract includes two kidneys, two ureters, a bladder, and a urethra. Your kidneys are a pair of bean-shaped organs. Each kidney is about the size of your fist. They are located below your ribs, one on each side of your spine.  CAUSES  Infections are caused by microbes, which are microscopic organisms, including fungi, viruses, and bacteria. These organisms are so small that they can only be seen through a microscope. Bacteria are the microbes that most commonly cause UTIs.  SYMPTOMS   Symptoms of UTIs may vary by age and gender of the patient and by the location of the infection. Symptoms in young women typically include a frequent and intense urge to urinate and a painful, burning feeling in the bladder or urethra during urination. Older women and men are more likely to be tired, shaky, and weak and have muscle aches and abdominal pain. A fever may mean the infection is in your kidneys. Other symptoms of a kidney infection include pain in your back or sides below the ribs, nausea, and vomiting.  DIAGNOSIS  To diagnose a UTI, your caregiver will ask you about your symptoms. Your caregiver also will ask to provide a urine sample. The urine sample will be tested for bacteria and white blood cells. White blood cells are made by your body to help fight infection.  TREATMENT   Typically, UTIs can be treated with medication. Because most UTIs are caused by a bacterial infection, they usually can be treated with the use of antibiotics. The choice of antibiotic and length of treatment depend on your symptoms and the type of bacteria causing your infection.  HOME CARE INSTRUCTIONS   If you were prescribed antibiotics, take them exactly as your caregiver instructs you. Finish the medication even if you feel better after you  have only taken some of the medication.   Drink enough water and fluids to keep your urine clear or pale yellow.   Avoid caffeine, tea, and carbonated beverages. They tend to irritate your bladder.   Empty your bladder often. Avoid holding urine for long periods of time.   Empty your bladder before and after sexual intercourse.   After a bowel movement, women should cleanse from front to back. Use each tissue only once.  SEEK MEDICAL CARE IF:    You have back pain.   You develop a fever.   Your symptoms do not begin to resolve within 3 days.  SEEK IMMEDIATE MEDICAL CARE IF:    You have severe back pain or lower abdominal pain.   You develop chills.   You have nausea or vomiting.   You have continued burning or discomfort with urination.  MAKE SURE YOU:    Understand these instructions.   Will watch your condition.   Will get help right away if you are not doing well or get worse.  Document Released: 06/22/2005 Document Revised: 03/13/2012 Document Reviewed: 10/21/2011  [employer] Patient Information 2014 [employer].

## 2013-08-29 LAB — URINE CULTURE
Colony Count: NO GROWTH
Organism ID, Bacteria: NO GROWTH

## 2013-09-04 ENCOUNTER — Other Ambulatory Visit: Payer: Self-pay | Admitting: Family Medicine

## 2013-10-04 ENCOUNTER — Other Ambulatory Visit: Payer: Self-pay | Admitting: Family Medicine

## 2013-10-04 MED ORDER — TIOTROPIUM BROMIDE MONOHYDRATE 18 MCG IN CAPS: ORAL_CAPSULE | RESPIRATORY_TRACT | Status: DC

## 2013-10-04 NOTE — Addendum Note (Signed)
Addended by: Thomasena EdisFLOYD, Katelen Luepke E on: 10/04/2013 09:17 AM   Modules accepted: Orders

## 2013-10-10 ENCOUNTER — Other Ambulatory Visit: Payer: Self-pay | Admitting: Family Medicine

## 2013-10-10 NOTE — Telephone Encounter (Signed)
Refill for 6 months. 

## 2013-10-10 NOTE — Telephone Encounter (Signed)
Last refill 04/08/13 #30 5 refills Last visit 08/28/13

## 2013-10-14 NOTE — Telephone Encounter (Signed)
Rx called to pharmacy

## 2013-11-02 ENCOUNTER — Other Ambulatory Visit: Payer: Self-pay | Admitting: Family Medicine

## 2014-02-26 ENCOUNTER — Ambulatory Visit (INDEPENDENT_AMBULATORY_CARE_PROVIDER_SITE_OTHER): Payer: Medicare Other | Admitting: Family Medicine

## 2014-02-26 ENCOUNTER — Encounter: Payer: Self-pay | Admitting: Family Medicine

## 2014-02-26 ENCOUNTER — Telehealth: Payer: Self-pay | Admitting: Family Medicine

## 2014-02-26 VITALS — BP 130/70 | HR 69 | Temp 98.2°F | Wt 114.0 lb

## 2014-02-26 DIAGNOSIS — G250 Essential tremor: Secondary | ICD-10-CM

## 2014-02-26 DIAGNOSIS — G252 Other specified forms of tremor: Secondary | ICD-10-CM

## 2014-02-26 DIAGNOSIS — I10 Essential (primary) hypertension: Secondary | ICD-10-CM

## 2014-02-26 DIAGNOSIS — J4489 Other specified chronic obstructive pulmonary disease: Secondary | ICD-10-CM

## 2014-02-26 DIAGNOSIS — J449 Chronic obstructive pulmonary disease, unspecified: Secondary | ICD-10-CM

## 2014-02-26 NOTE — Telephone Encounter (Signed)
Relevant patient education mailed to patient.  

## 2014-02-26 NOTE — Progress Notes (Signed)
Pre visit review using our clinic review tool, if applicable. No additional management support is needed unless otherwise documented below in the visit note. 

## 2014-02-26 NOTE — Progress Notes (Signed)
   Subjective:    Patient ID: [name], female    DOB: 01-17-1921, 80 y.o.   MRN: [id number]  HPI Patient here for routine medical followup.  Hypertension treated with HCTZ. Blood pressures been stable. No headaches. No dizziness. No recent chest pains. Compliant with medication  COPD. She remains on Spiriva once daily. Respiratory symptoms stable. She is not limited in activities of daily living. Denies any recent fever or chills.  She has history of chronic insomnia. She remains on low-dose clonazepam and is doing well. No recent falls.  Past Medical History  Diagnosis Date  . 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 Hypercalcemia 04/29/2010  . INSOMNIA, CHRONIC 04/29/2009  . FAMILIAL TREMOR 10/29/2009  . HYPERTENSION 04/29/2009  . Dysuria 10/29/2009   Past Surgical History  Procedure Laterality Date  . Appendectomy    . Abdominal hysterectomy  2001    reports that she quit smoking about 14 years ago. She does not have any smokeless tobacco history on file. Her alcohol and drug histories are not on file. family history includes Cancer in her other; Diabetes in her other; Hyperlipidemia in her other; Hypertension in her other. Allergies  Allergen Reactions  . Amoxicillin     Blotches on legs  . Amoxicillin [Amoxicillin]       Review of Systems  Constitutional: Negative for fatigue.  Eyes: Negative for visual disturbance.  Respiratory: Negative for cough, chest tightness and wheezing.   Cardiovascular: Negative for chest pain, palpitations and leg swelling.  Neurological: Negative for dizziness, seizures, syncope, weakness, light-headedness and headaches.  Psychiatric/Behavioral: Negative for confusion.       Objective:   Physical Exam  Constitutional: She is oriented to person, place, and time. She appears well-developed and well-nourished.  Neck: Neck supple. No thyromegaly present.  Cardiovascular: Normal rate.   Pulmonary/Chest: Effort normal.  Somewhat diminished  breath sounds throughout but no wheezes. No rales.  Musculoskeletal: She exhibits no edema.  Neurological: She is alert and oriented to person, place, and time.          Assessment & Plan:  #1 hypertension. Adequately controlled. Continue current medications. Check basic metabolic panel at followup in 6 months #2 COPD. Symptomatically stable. Continue Spiriva. Continue yearly flu vaccine #3 chronic insomnia. Sleep hygiene has been discussed. Continue low-dose clonazepam.

## 2014-03-03 ENCOUNTER — Other Ambulatory Visit: Payer: Self-pay | Admitting: Family Medicine

## 2014-03-10 ENCOUNTER — Other Ambulatory Visit: Payer: Self-pay | Admitting: Family Medicine

## 2014-03-10 NOTE — Telephone Encounter (Signed)
Last visit 02/26/14 Last refill 10/10/13 #30 5 refill

## 2014-03-10 NOTE — Telephone Encounter (Signed)
Refill for 6 months. 

## 2014-04-01 ENCOUNTER — Other Ambulatory Visit: Payer: Self-pay | Admitting: Family Medicine

## 2014-06-23 ENCOUNTER — Other Ambulatory Visit: Payer: Self-pay | Admitting: Family Medicine

## 2014-07-02 ENCOUNTER — Ambulatory Visit: Admission: RE | Admit: 2014-07-02 | Payer: Medicare Other | Source: Ambulatory Visit

## 2014-07-02 ENCOUNTER — Ambulatory Visit (INDEPENDENT_AMBULATORY_CARE_PROVIDER_SITE_OTHER): Payer: Medicare Other | Admitting: Family Medicine

## 2014-07-02 ENCOUNTER — Ambulatory Visit
Admission: RE | Admit: 2014-07-02 | Discharge: 2014-07-02 | Disposition: A | Discharge status: Home / Self Care | Payer: Medicare Other | Source: Ambulatory Visit | Attending: Family Medicine | Admitting: Family Medicine

## 2014-07-02 ENCOUNTER — Encounter: Payer: Self-pay | Admitting: Family Medicine

## 2014-07-02 ENCOUNTER — Ambulatory Visit (HOSPITAL_COMMUNITY)
Admission: RE | Admit: 2014-07-02 | Discharge: 2014-07-02 | Disposition: A | Discharge status: Home / Self Care | Payer: Medicare Other | Source: Ambulatory Visit | Attending: Family Medicine | Admitting: Family Medicine

## 2014-07-02 ENCOUNTER — Other Ambulatory Visit: Payer: Self-pay | Admitting: Family Medicine

## 2014-07-02 VITALS — BP 120/74 | HR 96 | Temp 97.9°F

## 2014-07-02 DIAGNOSIS — W19XXXA Unspecified fall, initial encounter: Secondary | ICD-10-CM | POA: Insufficient documentation

## 2014-07-02 DIAGNOSIS — M25552 Pain in left hip: Secondary | ICD-10-CM

## 2014-07-02 DIAGNOSIS — S80212A Abrasion, left knee, initial encounter: Secondary | ICD-10-CM

## 2014-07-02 DIAGNOSIS — M79652 Pain in left thigh: Secondary | ICD-10-CM

## 2014-07-02 DIAGNOSIS — M25562 Pain in left knee: Secondary | ICD-10-CM

## 2014-07-02 MED ORDER — HYDROCODONE-ACETAMINOPHEN 5-325 MG PO TABS: 1.0000 | ORAL_TABLET | Freq: Four times a day (QID) | ORAL | Status: DC | PRN

## 2014-07-02 NOTE — Progress Notes (Signed)
   Subjective:    Patient ID: [name], female    DOB: 01/31/1921, 80 y.o.   MRN: [id number]  HPI Patient seen for ED followup. On October 1 she was getting out of car and fell against a curb. She struck her left knee against the curb with some abrasions. [facility] and had x-rays reportedly of the knee which did not show fracture. She has been ambulating with a walker but has had fairly severe pain since then. Her pain is poorly localized and includes left hip, left femur shaft, and left knee. She's had some diffuse bruising and swelling of the lower extremity. She was prescribed hydrocodone which she is using sparingly with some relief of pain. She relates her pain 10 out of 10 at times  No head injury. No confusion. No upper extremity pain.  Past Medical History  Diagnosis Date  . 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 Hypercalcemia 04/29/2010  . INSOMNIA, CHRONIC 04/29/2009  . FAMILIAL TREMOR 10/29/2009  . HYPERTENSION 04/29/2009  . Dysuria 10/29/2009   Past Surgical History  Procedure Laterality Date  . Appendectomy    . Abdominal hysterectomy  2001    reports that she quit smoking about 14 years ago. She does not have any smokeless tobacco history on file. Her alcohol and drug histories are not on file. family history includes Cancer in her other; Diabetes in her other; Hyperlipidemia in her other; Hypertension in her other. Allergies  Allergen Reactions  . Amoxicillin     Blotches on legs  . Amoxicillin [Amoxicillin]       Review of Systems  Constitutional: Negative for fever and chills.  Respiratory: Negative for cough.   Cardiovascular: Positive for leg swelling. Negative for chest pain and palpitations.  Gastrointestinal: Negative for abdominal pain.  Neurological: Negative for dizziness.  Psychiatric/Behavioral: Negative for confusion.       Objective:   Physical Exam  Constitutional: She appears well-developed and well-nourished.  Cardiovascular:  Normal rate and regular rhythm.   Pulmonary/Chest: Effort normal and breath sounds normal. No respiratory distress. She has no wheezes. She has no rales.  Musculoskeletal:  Left lower extremity reveals some diffuse ecchymosis of the leg region. She has couple abrasions left anterior knee with no signs of secondary infection.  Diffuse tenderness which is poorly localized around the left knee region. She also has some distal left thigh tenderness. Limited range of motion left knee secondary to pain. We tried to rotate her left hip she has some pain with that as well          Assessment & Plan:  Left lower extremity pain following injury as above. She has very poorly localized pain with c/o pain left hip, left femur, left knee-but mostly distal femur and around knee. She has superficial abrasions of knee which are not infected. Her pain would seem to be out of proportion from contusions. Obtain x-rays left femur and left knee. Consider venous Dopplers if above negative.

## 2014-07-02 NOTE — Progress Notes (Signed)
Pre visit review using our clinic review tool, if applicable. No additional management support is needed unless otherwise documented below in the visit note. 

## 2014-07-03 NOTE — Addendum Note (Signed)
Addended by: Kristian CoveyBURCHETTE, Sergey Ishler W on: 07/03/2014 08:50 AM   Modules accepted: Orders

## 2014-07-04 ENCOUNTER — Ambulatory Visit (HOSPITAL_COMMUNITY): Payer: Medicare Other | Attending: Interventional Cardiology | Admitting: *Deleted

## 2014-07-04 ENCOUNTER — Telehealth: Payer: Self-pay

## 2014-07-04 DIAGNOSIS — Z87891 Personal history of nicotine dependence: Secondary | ICD-10-CM | POA: Diagnosis not present

## 2014-07-04 DIAGNOSIS — I10 Essential (primary) hypertension: Secondary | ICD-10-CM | POA: Diagnosis not present

## 2014-07-04 DIAGNOSIS — J449 Chronic obstructive pulmonary disease, unspecified: Secondary | ICD-10-CM | POA: Insufficient documentation

## 2014-07-04 DIAGNOSIS — M79605 Pain in left leg: Secondary | ICD-10-CM

## 2014-07-04 DIAGNOSIS — M79652 Pain in left thigh: Secondary | ICD-10-CM

## 2014-07-04 DIAGNOSIS — M79606 Pain in leg, unspecified: Secondary | ICD-10-CM | POA: Insufficient documentation

## 2014-07-04 NOTE — Telephone Encounter (Signed)
Message copied by Thomasena EdisFLOYD, Oliverio Cho E on Fri Jul 04, 2014  1:32 PM ------      Message from: Kristian CoveyBURCHETTE, BRUCE W      Created: Fri Jul 04, 2014  1:05 PM      Regarding: FW: Lower Venous Duplex       Madalin Hughart            Would you let Mr Earna Coderuttle know NO DVT.  Thanks.       ----- Message -----         From: Braulio Conteachel I Jernigan         Sent: 07/04/2014  12:15 PM           To: Kristian CoveyBruce W Burchette, MD      Subject: Lower Venous Duplex                                      By lower extremity venous duplex, there does not appear to be DVT in the left leg.        ------

## 2014-07-04 NOTE — Progress Notes (Signed)
Venous Duplex Lower Left Performed 

## 2014-07-04 NOTE — Telephone Encounter (Signed)
Called no answer and no VM set up

## 2014-07-07 NOTE — Telephone Encounter (Signed)
Message copied by Thomasena EdisFLOYD, Laketa Sandoz E on Mon Jul 07, 2014  8:06 AM ------      Message from: Kristian CoveyBURCHETTE, BRUCE W      Created: Fri Jul 04, 2014  1:05 PM      Regarding: FW: Lower Venous Duplex       Payton Moder            Would you let Mr Earna Coderuttle know NO DVT.  Thanks.       ----- Message -----         From: Braulio Conteachel I Jernigan         Sent: 07/04/2014  12:15 PM           To: Kristian CoveyBruce W Burchette, MD      Subject: Lower Venous Duplex                                      By lower extremity venous duplex, there does not appear to be DVT in the left leg.        ------

## 2014-07-07 NOTE — Telephone Encounter (Signed)
Pt informed

## 2014-07-15 ENCOUNTER — Encounter (HOSPITAL_COMMUNITY): Payer: Self-pay | Admitting: Emergency Medicine

## 2014-07-15 ENCOUNTER — Emergency Department (HOSPITAL_COMMUNITY): Payer: Medicare Other

## 2014-07-15 ENCOUNTER — Inpatient Hospital Stay (HOSPITAL_COMMUNITY)
Admission: EM | Admit: 2014-07-15 | Discharge: 2014-07-18 | DRG: 544 | Disposition: A | Discharge status: Skilled Nursing Facility | Payer: Medicare Other | Attending: Internal Medicine | Admitting: Internal Medicine

## 2014-07-15 DIAGNOSIS — Z87891 Personal history of nicotine dependence: Secondary | ICD-10-CM | POA: Diagnosis not present

## 2014-07-15 DIAGNOSIS — M5136 Other intervertebral disc degeneration, lumbar region: Secondary | ICD-10-CM | POA: Diagnosis present

## 2014-07-15 DIAGNOSIS — J439 Emphysema, unspecified: Secondary | ICD-10-CM

## 2014-07-15 DIAGNOSIS — J42 Unspecified chronic bronchitis: Secondary | ICD-10-CM

## 2014-07-15 DIAGNOSIS — Z90710 Acquired absence of both cervix and uterus: Secondary | ICD-10-CM | POA: Diagnosis not present

## 2014-07-15 DIAGNOSIS — M4856XA Collapsed vertebra, not elsewhere classified, lumbar region, initial encounter for fracture: Principal | ICD-10-CM | POA: Diagnosis present

## 2014-07-15 DIAGNOSIS — I700 Atherosclerosis of aorta: Secondary | ICD-10-CM | POA: Diagnosis present

## 2014-07-15 DIAGNOSIS — M129 Arthropathy, unspecified: Secondary | ICD-10-CM | POA: Diagnosis present

## 2014-07-15 DIAGNOSIS — M792 Neuralgia and neuritis, unspecified: Secondary | ICD-10-CM | POA: Diagnosis present

## 2014-07-15 DIAGNOSIS — R609 Edema, unspecified: Secondary | ICD-10-CM | POA: Diagnosis present

## 2014-07-15 DIAGNOSIS — R300 Dysuria: Secondary | ICD-10-CM

## 2014-07-15 DIAGNOSIS — W19XXXA Unspecified fall, initial encounter: Secondary | ICD-10-CM | POA: Diagnosis present

## 2014-07-15 DIAGNOSIS — G4700 Insomnia, unspecified: Secondary | ICD-10-CM | POA: Diagnosis present

## 2014-07-15 DIAGNOSIS — I10 Essential (primary) hypertension: Secondary | ICD-10-CM | POA: Diagnosis present

## 2014-07-15 DIAGNOSIS — S32010D Wedge compression fracture of first lumbar vertebra, subsequent encounter for fracture with routine healing: Secondary | ICD-10-CM

## 2014-07-15 DIAGNOSIS — J449 Chronic obstructive pulmonary disease, unspecified: Secondary | ICD-10-CM | POA: Diagnosis present

## 2014-07-15 DIAGNOSIS — X58XXXA Exposure to other specified factors, initial encounter: Secondary | ICD-10-CM | POA: Diagnosis present

## 2014-07-15 DIAGNOSIS — M47896 Other spondylosis, lumbar region: Secondary | ICD-10-CM | POA: Diagnosis present

## 2014-07-15 DIAGNOSIS — F329 Major depressive disorder, single episode, unspecified: Secondary | ICD-10-CM | POA: Diagnosis present

## 2014-07-15 DIAGNOSIS — S32010A Wedge compression fracture of first lumbar vertebra, initial encounter for closed fracture: Secondary | ICD-10-CM

## 2014-07-15 DIAGNOSIS — G8929 Other chronic pain: Secondary | ICD-10-CM | POA: Diagnosis present

## 2014-07-15 DIAGNOSIS — G252 Other specified forms of tremor: Secondary | ICD-10-CM

## 2014-07-15 DIAGNOSIS — G250 Essential tremor: Secondary | ICD-10-CM

## 2014-07-15 LAB — CBC WITH DIFFERENTIAL/PLATELET
Basophils Absolute: 0 10*3/uL (ref 0.0–0.1)
Basophils Relative: 0 % (ref 0–1)
Eosinophils Absolute: 0.1 10*3/uL (ref 0.0–0.7)
Eosinophils Relative: 2 % (ref 0–5)
HCT: 29.8 % — ABNORMAL LOW (ref 36.0–46.0)
Hemoglobin: 9.9 g/dL — ABNORMAL LOW (ref 12.0–15.0)
LYMPHS ABS: 0.5 10*3/uL — AB (ref 0.7–4.0)
LYMPHS PCT: 7 % — AB (ref 12–46)
MCH: 30.3 pg (ref 26.0–34.0)
MCHC: 33.2 g/dL (ref 30.0–36.0)
MCV: 91.1 fL (ref 78.0–100.0)
Monocytes Absolute: 0.3 10*3/uL (ref 0.1–1.0)
Monocytes Relative: 4 % (ref 3–12)
NEUTROS ABS: 6.6 10*3/uL (ref 1.7–7.7)
NEUTROS PCT: 87 % — AB (ref 43–77)
PLATELETS: 270 10*3/uL (ref 150–400)
RBC: 3.27 MIL/uL — AB (ref 3.87–5.11)
RDW: 14 % (ref 11.5–15.5)
WBC: 7.5 10*3/uL (ref 4.0–10.5)

## 2014-07-15 LAB — BASIC METABOLIC PANEL
Anion gap: 11 (ref 5–15)
BUN: 17 mg/dL (ref 6–23)
CHLORIDE: 98 meq/L (ref 96–112)
CO2: 29 meq/L (ref 19–32)
Calcium: 9.2 mg/dL (ref 8.4–10.5)
Creatinine, Ser: 0.67 mg/dL (ref 0.50–1.10)
GFR calc Af Amer: 86 mL/min — ABNORMAL LOW (ref >90)
GFR calc non Af Amer: 75 mL/min — ABNORMAL LOW (ref >90)
GLUCOSE: 131 mg/dL — AB (ref 70–99)
Potassium: 4 mEq/L (ref 3.7–5.3)
SODIUM: 138 meq/L (ref 137–147)

## 2014-07-15 MED ORDER — CALCIUM CARBONATE-VITAMIN D 500-200 MG-UNIT PO TABS
1.0000 | ORAL_TABLET | Freq: Every day | ORAL | Status: DC
  Administered 2014-07-16 – 2014-07-18 (×3): 1 via ORAL
  Filled 2014-07-15 (×6): qty 1

## 2014-07-15 MED ORDER — HYDROCODONE-ACETAMINOPHEN 5-325 MG PO TABS
1.0000 | ORAL_TABLET | ORAL | Status: DC | PRN
Start: 2014-07-15 — End: 2014-07-18

## 2014-07-15 MED ORDER — ACETAMINOPHEN 325 MG PO TABS: 650.0000 mg | ORAL_TABLET | Freq: Four times a day (QID) | ORAL | Status: DC | PRN

## 2014-07-15 MED ORDER — GABAPENTIN 100 MG PO CAPS
200.0000 mg | ORAL_CAPSULE | Freq: Every day | ORAL | Status: DC
  Administered 2014-07-15 – 2014-07-17 (×3): 200 mg via ORAL
  Filled 2014-07-15 (×3): qty 2

## 2014-07-15 MED ORDER — SODIUM CHLORIDE 0.9 % IV BOLUS (SEPSIS)
250.0000 mL | Freq: Once | INTRAVENOUS | Status: AC
  Administered 2014-07-15: 250 mL via INTRAVENOUS

## 2014-07-15 MED ORDER — FERROUS SULFATE 325 (65 FE) MG PO TABS
325.0000 mg | ORAL_TABLET | Freq: Two times a day (BID) | ORAL | Status: DC
  Administered 2014-07-16 – 2014-07-18 (×5): 325 mg via ORAL
  Filled 2014-07-15 (×6): qty 1

## 2014-07-15 MED ORDER — ONDANSETRON HCL 4 MG/2ML IJ SOLN: 4.0000 mg | Freq: Four times a day (QID) | INTRAMUSCULAR | Status: DC | PRN

## 2014-07-15 MED ORDER — HYDROCHLOROTHIAZIDE 25 MG PO TABS
25.0000 mg | ORAL_TABLET | Freq: Every day | ORAL | Status: DC
  Administered 2014-07-16 – 2014-07-18 (×2): 25 mg via ORAL
  Filled 2014-07-15 (×2): qty 1

## 2014-07-15 MED ORDER — METHOCARBAMOL 500 MG PO TABS
500.0000 mg | ORAL_TABLET | Freq: Three times a day (TID) | ORAL | Status: DC | PRN
  Administered 2014-07-16: 500 mg via ORAL
  Filled 2014-07-15: qty 1

## 2014-07-15 MED ORDER — HYDROCODONE-ACETAMINOPHEN 5-325 MG PO TABS
1.0000 | ORAL_TABLET | Freq: Once | ORAL | Status: AC
  Administered 2014-07-15: 1 via ORAL
  Filled 2014-07-15: qty 1

## 2014-07-15 MED ORDER — ALBUTEROL SULFATE (2.5 MG/3ML) 0.083% IN NEBU: 3.0000 mL | INHALATION_SOLUTION | Freq: Four times a day (QID) | RESPIRATORY_TRACT | Status: DC | PRN

## 2014-07-15 MED ORDER — HYDROMORPHONE HCL 1 MG/ML IJ SOLN
0.5000 mg | INTRAMUSCULAR | Status: DC | PRN
  Administered 2014-07-15 – 2014-07-16 (×3): 0.5 mg via INTRAVENOUS
  Filled 2014-07-15 (×3): qty 1

## 2014-07-15 MED ORDER — TIOTROPIUM BROMIDE MONOHYDRATE 18 MCG IN CAPS
18.0000 ug | ORAL_CAPSULE | Freq: Every day | RESPIRATORY_TRACT | Status: DC
  Administered 2014-07-16 – 2014-07-17 (×2): 18 ug via RESPIRATORY_TRACT
  Filled 2014-07-15: qty 5

## 2014-07-15 MED ORDER — HYDROMORPHONE HCL 1 MG/ML IJ SOLN
0.5000 mg | Freq: Once | INTRAMUSCULAR | Status: AC
Start: 2014-07-15 — End: 2014-07-15
  Administered 2014-07-15: 0.5 mg via INTRAVENOUS

## 2014-07-15 MED ORDER — HYDROCODONE-ACETAMINOPHEN 5-325 MG PO TABS
1.0000 | ORAL_TABLET | Freq: Once | ORAL | Status: AC
Start: 2014-07-15 — End: 2014-07-15
  Administered 2014-07-15: 1 via ORAL
  Filled 2014-07-15: qty 1

## 2014-07-15 MED ORDER — ALBUTEROL SULFATE (2.5 MG/3ML) 0.083% IN NEBU: 2.5000 mg | INHALATION_SOLUTION | RESPIRATORY_TRACT | Status: DC | PRN

## 2014-07-15 MED ORDER — CLONAZEPAM 0.5 MG PO TABS
0.5000 mg | ORAL_TABLET | Freq: Every day | ORAL | Status: DC
  Administered 2014-07-15 – 2014-07-17 (×3): 0.5 mg via ORAL
  Filled 2014-07-15 (×3): qty 1

## 2014-07-15 MED ORDER — HYDROMORPHONE HCL 1 MG/ML IJ SOLN: 0.5000 mg | INTRAMUSCULAR | Status: DC | PRN

## 2014-07-15 MED ORDER — HYDROMORPHONE HCL 1 MG/ML IJ SOLN
0.5000 mg | Freq: Once | INTRAMUSCULAR | Status: AC
  Administered 2014-07-15: 0.5 mg via INTRAVENOUS
  Filled 2014-07-15: qty 1

## 2014-07-15 MED ORDER — ENOXAPARIN SODIUM 40 MG/0.4ML ~~LOC~~ SOLN
40.0000 mg | SUBCUTANEOUS | Status: DC
  Administered 2014-07-16 – 2014-07-18 (×3): 40 mg via SUBCUTANEOUS
  Filled 2014-07-15 (×4): qty 0.4

## 2014-07-15 MED ORDER — ONDANSETRON HCL 4 MG PO TABS: 4.0000 mg | ORAL_TABLET | Freq: Four times a day (QID) | ORAL | Status: DC | PRN

## 2014-07-15 MED ORDER — SODIUM CHLORIDE 0.9 % IJ SOLN
3.0000 mL | Freq: Two times a day (BID) | INTRAMUSCULAR | Status: DC
  Administered 2014-07-15 – 2014-07-18 (×6): 3 mL via INTRAVENOUS

## 2014-07-15 MED ORDER — ACETAMINOPHEN 650 MG RE SUPP: 650.0000 mg | Freq: Four times a day (QID) | RECTAL | Status: DC | PRN

## 2014-07-15 NOTE — Care Management (Signed)
ED CM consulted by K. Sofia PA-C concerning [place] services.  Patient present to [facility] ED by EMS after falling out of bed this am, family reports patient has had recurrent falls.  ED evaluation done,patient not meeting criteria for admission, no acute finding.  Record reviewed,insured by [facility] Met with patient and family at bedside. Verbal consent given to discuss care in the presence of family. Discussed the recommendation for [facility] RN, PT, [facility]  services, patient and family agreeable. Offered choice, list provided, [facility] selected. Verified address and phone number where patient will be receiving services. Referral faxed to [phone], and received fax confirmation. Explained that someone from [facility] will contact patient and family by phone 23- 48 hours, provided my contact information along with [facility] information to contact if they should have any further questions or concerns. Patient and family verbalized understanding and appreciation for the assistance. Updated K. Saunders Revel and [employer] manager. ED CM will follow up with patient and family tomorrow.

## 2014-07-15 NOTE — ED Provider Notes (Signed)
Pt state she fell this morning and she now has pain in her lower back. She denies prior back pain. She denies LOC.   Pt is thin elderly frail female who appears uncomfortable, laying on her right side b/o pain. She has no pain to palpation in her thoracic spine, she has diffuse tenderness of her lumbar spine.  Medical screening examination/treatment/procedure(s) were conducted as a shared visit with non-physician practitioner(s) and myself.  I personally evaluated the patient during the encounter.   EKG Interpretation   Date/Time:  Tuesday July 15 2014 09:37:28 EDT Ventricular Rate:  86 PR Interval:  239 QRS Duration: 82 QT Interval:  343 QTC Calculation: 410 R Axis:   34 Text Interpretation:  Sinus rhythm Prolonged PR interval Electrode noise  No significant change since last tracing 30 Oct 2010 Confirmed by [facility]   MD-I, Milicent Acheampong ([id number]) on 07/15/2014 10:35:05 AM       Devoria AlbeIva Ahni Bradwell, MD, [facility]    Larinda Herter L Avalee Castrellon, MD 07/15/14 1038

## 2014-07-15 NOTE — ED Notes (Signed)
[employer] representative at bedside applying brace at this time.

## 2014-07-15 NOTE — ED Notes (Signed)
Patient transported to CT 

## 2014-07-15 NOTE — ED Notes (Signed)
Per EMS when they arrived on her knees bent and laying over walker. She reported trying to get out of bed, losing her footing, and falling forward. Denies any LOC or symptoms prior to fall. Reports stayed in that position due to back pain. Comes to ED for lumbar back pain. Second fall this month.

## 2014-07-15 NOTE — ED Notes (Signed)
Patient returned from X-ray 

## 2014-07-15 NOTE — Progress Notes (Signed)
Orthopedic Tech Progress Note Patient Details:  [name] 12/17/1920 [id number]  Patient ID: [name], female   DOB: 10/09/1920, 80 y.o.   MRN: [id number] Called in [employer] brace order; spoke with Richardean Chimeraathy   Hannie Shoe 07/15/2014, 2:13 PM

## 2014-07-15 NOTE — ED Provider Notes (Signed)
CSN: [id number]     Arrival date & time 07/15/14  4098 History   First MD Initiated Contact with Patient 07/15/14 [phone]     Chief Complaint  Patient presents with  . Fall  . Back Pain     (Consider location/radiation/quality/duration/timing/severity/associated sxs/prior Treatment) Patient is a 80 y.o. female presenting with fall and back pain. The history is provided by the patient. No language interpreter was used.  Fall This is a new problem. The current episode started today. The problem occurs constantly. The problem has been gradually worsening. Pertinent negatives include no abdominal pain or neck pain. Nothing aggravates the symptoms. She has tried nothing for the symptoms.  Back Pain Location:  Lumbar spine Quality:  Aching Radiates to:  Does not radiate Pain severity:  Moderate Onset quality:  Gradual Timing:  Constant Context: falling   Associated symptoms: no abdominal pain   Risk factors: hx of osteoporosis   Risk factors: no hx of cancer   Pt slipped and fell while getting out of bed.    Past Medical History  Diagnosis Date  . 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 Hypercalcemia 04/29/2010  . INSOMNIA, CHRONIC 04/29/2009  . FAMILIAL TREMOR 10/29/2009  . HYPERTENSION 04/29/2009  . Dysuria 10/29/2009   Past Surgical History  Procedure Laterality Date  . Appendectomy    . Abdominal hysterectomy  2001   Family History  Problem Relation Age of Onset  . Cancer Other     breast, colon  . Diabetes Other   . Hyperlipidemia Other   . Hypertension Other    History  Substance Use Topics  . Smoking status: Former Smoker -- 1.00 packs/day for 50 years    Quit date: 11/12/1999  . Smokeless tobacco: Not on file  . Alcohol Use: Not on file   OB History   Grav Para Term Preterm Abortions TAB SAB Ect Mult Living                 Review of Systems  Gastrointestinal: Negative for abdominal pain.  Musculoskeletal: Positive for back pain. Negative for neck pain.  All other  systems reviewed and are negative.     Allergies  Amoxicillin and Amoxicillin  Home Medications   Prior to Admission medications   Medication Sig Start Date End Date Taking? Authorizing Provider  albuterol (PROVENTIL HFA;VENTOLIN HFA) 108 (90 BASE) MCG/ACT inhaler Inhale 1-2 puffs into the lungs every 6 (six) hours as needed for wheezing or shortness of breath.   Yes Historical Provider, MD  calcium-vitamin D (OSCAL) 250-125 MG-UNIT per tablet Take 1 tablet by mouth daily.     Yes Historical Provider, MD  clonazePAM (KLONOPIN) 0.5 MG tablet Take 0.5 mg by mouth at bedtime.   Yes Historical Provider, MD  ferrous sulfate 325 (65 FE) MG tablet Take 325 mg by mouth 2 (two) times daily.     Yes Historical Provider, MD  gabapentin (NEURONTIN) 100 MG capsule Take 200 mg by mouth at bedtime.   Yes Historical Provider, MD  hydrochlorothiazide (HYDRODIURIL) 25 MG tablet TAKE 1 TABLET ONCE A DAY 11/02/13  Yes Kristian Covey, MD  HYDROcodone-acetaminophen (NORCO/VICODIN) 5-325 MG per tablet Take 1-2 tablets by mouth every 6 (six) hours as needed for moderate pain. 07/02/14  Yes Kristian Covey, MD  ibuprofen (ADVIL,MOTRIN) 200 MG tablet Take 200 mg by mouth every 6 (six) hours as needed.   Yes Historical Provider, MD  tiotropium (SPIRIVA) 18 MCG inhalation capsule Place 18 mcg into inhaler and inhale daily.  Yes Historical Provider, MD  Multiple Vitamins-Minerals (CENTRUM SILVER ULTRA WOMENS) TABS Take by mouth daily.      Historical Provider, MD  Omega-3 Fatty Acids (FISH OIL) 1200 MG CAPS Take by mouth daily.      Historical Provider, MD   BP 165/97  Pulse 78  Temp(Src) 98.2 F (36.8 C) (Oral)  Resp 23  SpO2 98% Physical Exam  Nursing note and vitals reviewed. Constitutional: She appears well-developed and well-nourished.  HENT:  Head: Normocephalic.  Right Ear: External ear normal.  Left Ear: External ear normal.  Neck: Normal range of motion.  Cardiovascular: Normal rate.    Pulmonary/Chest: Effort normal.  Abdominal: Soft.  Musculoskeletal: She exhibits tenderness.  Tender lower thoracic, upper lumbar area to palpation  Neurological: She is alert.  Skin: Skin is warm.  Psychiatric: She has a normal mood and affect.    ED Course  Procedures (including critical care time) Labs Review Labs Reviewed - No data to display  Imaging Review Dg Thoracic Spine 2 View  07/15/2014   CLINICAL DATA:  Larey SeatFell today.  Mid and lower back pain.  EXAM: THORACIC SPINE - 2 VIEW; LUMBAR SPINE - COMPLETE 4+ VIEW  COMPARISON:  Lateral chest x-ray 05/12/2011  FINDINGS: Thoracic spine:  Normal alignment of the thoracic vertebral bodies. There are remote compression fractures without definite new/acute compression fracture. No abnormal paraspinal soft tissue swelling. Aortic calcifications are noted.  Lumbar spine: Moderate degenerative lumbar spondylosis with multilevel disc disease and facet disease. There is a superior endplate depression of L1 which is new since 2012 but of uncertain acuity. No definite pars defects. The visualized bony pelvis is intact. Extensive vascular calcifications are noted.  IMPRESSION: Remote thoracic compression fractures.  No acute fracture.  L1 compression deformity of uncertain age.  It is new since 2012.   Electronically Signed   By: Loralie ChampagneMark  Gallerani M.D.   On: 07/15/2014 10:57   Dg Lumbar Spine Complete  07/15/2014   CLINICAL DATA:  Larey SeatFell today.  Mid and lower back pain.  EXAM: THORACIC SPINE - 2 VIEW; LUMBAR SPINE - COMPLETE 4+ VIEW  COMPARISON:  Lateral chest x-ray 05/12/2011  FINDINGS: Thoracic spine:  Normal alignment of the thoracic vertebral bodies. There are remote compression fractures without definite new/acute compression fracture. No abnormal paraspinal soft tissue swelling. Aortic calcifications are noted.  Lumbar spine: Moderate degenerative lumbar spondylosis with multilevel disc disease and facet disease. There is a superior endplate depression  of L1 which is new since 2012 but of uncertain acuity. No definite pars defects. The visualized bony pelvis is intact. Extensive vascular calcifications are noted.  IMPRESSION: Remote thoracic compression fractures.  No acute fracture.  L1 compression deformity of uncertain age.  It is new since 2012.   Electronically Signed   By: Loralie ChampagneMark  Gallerani M.D.   On: 07/15/2014 10:57   Ct Lumbar Spine Wo Contrast  07/15/2014   CLINICAL DATA:  Status post fall, back pain  EXAM: CT LUMBAR SPINE WITHOUT CONTRAST  TECHNIQUE: Multidetector CT imaging of the lumbar spine was performed without intravenous contrast administration. Multiplanar CT image reconstructions were also generated.  COMPARISON:  None.  FINDINGS: The alignment is anatomic. There is an acute L1 vertebral body compression fracture through the superior endplate with approximately 20% depression. There is 4 mm of retropulsion of the superior posterior margin of the L1 vertebral body with mild impression on the ventral thecal sac.  The remainder the vertebral body heights are maintained. There is no acute fracture  or static listhesis. The paravertebral soft tissues are normal. The intraspinal soft tissues are not fully imaged on this examination due to poor soft tissue contrast, but there is no gross soft tissue abnormality.  There is degenerative disc disease at L3-4 and L4-5. There is mild bilateral facet arthropathy throughout the lumbar spine.  There is abdominal aortic atherosclerosis.  IMPRESSION: 1. Acute, L1 vertebral body compression fracture through the superior endplate with approximately 20% depression. 4 mm of retropulsion of the superior posterior margin of L1 with mild flattening of the ventral thecal sac.   Electronically Signed   By: Elige KoHetal  Patel   On: 07/15/2014 12:57     EKG Interpretation   Date/Time:  Tuesday July 15 2014 09:37:28 EDT Ventricular Rate:  86 PR Interval:  239 QRS Duration: 82 QT Interval:  343 QTC Calculation:  410 R Axis:   34 Text Interpretation:  Sinus rhythm Prolonged PR interval Electrode noise  No significant change since last tracing 30 Oct 2010 Conf[facility] by [facility]   MD-I, IVA ([id number]) on 07/15/2014 10:35:05 AM      MDM Xray shows an L1 compression fracture,  Radiologist advised Ct of ls spine   CT shows l1 compression fracture   I spoke to Dr. Ailene RavelKritxer who advised TLSO brace and follow up in his office in 2 weeks for recheck.    Final diagnoses:  Compression fracture of L1 lumbar vertebra, closed, initial encounter    Hydrocodone AVS     Elson AreasLeslie K Caylin Nass, PA-C 07/15/14 1516

## 2014-07-15 NOTE — ED Notes (Signed)
Bio tech stated she needed to wear brace when standing or walking; patient is now being re-evaluated for admit and wanted brace removed. Removed brace, changed back into hospital gown, and repositioned in bed.

## 2014-07-15 NOTE — ED Notes (Signed)
Back brace not available on the floor at transfer time, family member left the ER room.  Pt transferred to the floor follow up with brace location.

## 2014-07-15 NOTE — ED Notes (Signed)
Hospitalist at the bedside 

## 2014-07-15 NOTE — ED Provider Notes (Signed)
See prior note   Ward GivensIva L Croix Presley, MD 07/15/14 1517

## 2014-07-15 NOTE — ED Notes (Signed)
Ortho tech paged for brace application. 

## 2014-07-15 NOTE — ED Provider Notes (Signed)
  Physical Exam  BP 167/92  Pulse 93  Temp(Src) 98.2 F (36.8 C) (Oral)  Resp 24  SpO2 96%  Physical Exam  ED Course  Procedures  MDM Assumed care of pt from Dr. Lars MageI Knapp.  Briefly, pt is presenting with new L1 compression fracture.  Plan was to dc home, however pt is 80 y.o. and lives at home alone with her husband and fell due to functional disability due to L knee pain from recent fall.  I do not feel her safe to dc home given that she was unable to get in and out of bed unassisted or assisted by husband.  Consulted for admission.  Feel admission necessary for IV pain control (required 2 doses of IV dilaudid in ED) and pt safety.      Mirian MoMatthew Gentry, MD 07/15/14 2103

## 2014-07-15 NOTE — ED Notes (Signed)
Patient returned from CT

## 2014-07-15 NOTE — Discharge Instructions (Signed)
Lumbar Fracture °A fracture of a bone is the same as a break in the bone. A fracture in the lumbar area is a break that involves one of many parts that make up the 5 bones of the low back area. This is just above the pelvis.  °CAUSES °Most of these injuries occur as a result of an accident such as: °· A fall. °· A car accident. °· Recreational activities. °· A smaller number occur due to: °· Industrial, farm, and aviation accidents. °· Gunshot wounds and direct blows to the back. °· Parachuting incidents. °Most lumbar fractures affect the "building blocks" or the main portion of the spine known as the "vertebral bodies" (see the image on the right). A smaller number involve breaks to portions of bone that extend to the sides or backward behind the vertebral body. In the elderly, a sudden break can happen without an apparent cause. This is because the bones of the back have become extremely thin and fragile. This condition is known as osteoporosis. °SYMPTOMS °Patients with lumbar fractures have severe pain even if the actual break is small or limited, and there is no injury to nearby nerves. More severe or complex injuries involving other bones and/or organs may include:  °· Deformity of the back bones. °· Swelling/bruising over the injured area. °· Limited ability to move the affected area. °· Partial or complete loss of function of the bladder and/or bowels. (This may be due to injury to nearby nerves). °· More severe injuries can also cause: °¨ Loss of sensation and/or strength in the legs, feet, and toes. °¨ Paralysis. °DIAGNOSIS °In most cases, a lumbar fracture will be suspected by what happened just prior to the onset of back pain. X-rays and special imaging (CT scan and MRI imaging) are used to confirm the diagnosis as well as finding out the type and severity of the break or breaks. These tests guide treatment. But there are times when special imaging cannot be done. For example, MRI cannot be done if there  is an implanted metallic device (such as a pacemaker). In these cases, other tests and imaging are done. °If there has been nerve damage, more tests can be done. These include: °· Tests of nerve function through muscles (nerve conduction studies and electromyography). °· Tests of bladder function (urodynamics). °· Tests that focus on defining specific nerve problems before surgery and what improvement has come about after surgery (evoked potentials). °TREATMENT °Common injuries may involve a small break off of the main surface of the back bone. Or they may be in the form of a partial flattening or compression of the bone. Hospital care may not be needed for these. Medicine for pain control, special back bracing, and limitations in activity are done first. Physical therapy follows later. °Complex breaks, multiple fractures of the spine, or unstable injuries can damage the spinal cord. They may require an operation to remove pressure from the nerves and/or spinal cord and to stabilize the broken pieces of bone. Each individual set of injuries is unique. The surgeon will take into consideration many things when planning the best surgical approach that will give the highest likelihood of a good outcome.  °HOME CARE INSTRUCTIONS °There is pain and stiffness in the back for weeks after a vertebral fracture. Bed rest, pain medicine, and a slow return to activity are generally recommended. Neck and back braces may be helpful in reducing pain and increasing mobility. When your pain allows, simple walking will help to begin the   process of returning to normal activities. Exercises to improve motion and to strengthen the back may also be useful after the initial pain goes away. This will be guided by your caregiver and the team (nurses, physical therapists, occupational therapists, etc.) involved with your ongoing care. For the elderly, treatment for osteoporosis may be needed to help reduce the risk of fractures in the  future. °Arrange for follow-up care as recommended to assure proper long-term care and prevention of further spine injury. The failure to follow-up as recommended could result in permanent injury, disability, and a chronic painful condition. °SEEK MEDICAL CARE IF: °· Pain is not effectively controlled with medication. °· You feel unable to decrease pain medication over time as planned. °· Activity level is not improving as planned and/or expected. °SEEK IMMEDIATE MEDICAL CARE IF: °· You have increasing pain, vomiting, or are unable to move around at all. °· You have numbness, tingling, weakness, or paralysis of any part of your body. °· You have loss of normal bowel or bladder control. °· You have difficulty breathing, cough, fever, chest or abdominal pain. °Document Released: 12/28/2006 Document Revised: 12/05/2011 Document Reviewed: 08/28/2007 °ExitCare® Patient Information ©2015 [employer]. This information is not intended to replace advice given to you by your health care provider. Make sure you discuss any questions you have with your health care provider. ° °Back, Compression Fracture °A compression fracture happens when a force is put upon the length of your spine. Slipping and falling on your bottom are examples of such a force. When this happens, sometimes the force is great enough to compress the building blocks (vertebral bodies) of your spine. Although this causes a lot of pain, this can usually be treated at home, unless your caregiver feels hospitalization is needed for pain control. °Your backbone (spinal column) is made up of 24 main vertebral bodies in addition to the sacrum and coccyx (see illustration). These are held together by tough fibrous tissues (ligaments) and by support of your muscles. Nerve roots pass through the openings between the vertebrae. A sudden wrenching move, injury, or a fall may cause a compression fracture of one of the vertebral bodies. This may result in back pain or  spread of pain into the belly (abdomen), the buttocks, and down the leg into the foot. Pain may also be created by muscle spasm alone. °Large studies have been undertaken to determine the best possible course of action to help your back following injury and also to prevent future problems. The recommendations are as follows. °FOLLOWING A COMPRESSION FRACTURE: °Do the following only if advised by your caregiver.  °· If a back brace has been suggested or provided, wear it as directed. °· Do not stop wearing the back brace unless instructed by your caregiver. °· When allowed to return to regular activities, avoid a sedentary lifestyle. Actively exercise. Sporadic weekend binges of tennis, racquetball, or waterskiing may actually aggravate or create problems, especially if you are not in condition for that activity. °· Avoid sports requiring sudden body movements until you are in condition for them. Swimming and walking are safer activities. °· Maintain good posture. °· Avoid obesity. °· If not already done, you should have a DEXA scan. Based on the results, be treated for osteoporosis. °FOLLOWING ACUTE (SUDDEN) INJURY: °· Only take over-the-counter or prescription medicines for pain, discomfort, or fever as directed by your caregiver. °· Use bed rest for only the most extreme acute episode. Prolonged bed rest may aggravate your condition. Ice used for   acute conditions is effective. Use a large plastic bag filled with ice. Wrap it in a towel. This also provides excellent pain relief. This may be continuous. Or use it for 30 minutes every 2 hours during acute phase, then as needed. Heat for 30 minutes prior to activities is helpful. °· As soon as the acute phase (the time when your back is too painful for you to do normal activities) is over, it is important to resume normal activities and work hardening programs. Back injuries can cause potentially marked changes in lifestyle. So it is important to attack these problems  aggressively. °· See your caregiver for continued problems. He or she can help or refer you for appropriate exercises, physical therapy, and work hardening if needed. °· If you are given narcotic medications for your condition, for the next 24 hours do not: °¨ Drive. °¨ Operate machinery or power tools. °¨ Sign legal documents. °· Do not drink alcohol, or take sleeping pills or other medications that may interfere with treatment. °If your caregiver has given you a follow-up appointment, it is very important to keep that appointment. Not keeping the appointment could result in a chronic or permanent injury, pain, and disability. If there is any problem keeping the appointment, you must call back to this facility for assistance.  °SEEK IMMEDIATE MEDICAL CARE IF: °· You develop numbness, tingling, weakness, or problems with the use of your arms or legs. °· You develop severe back pain not relieved with medications. °· You have changes in bowel or bladder control. °· You have increasing pain in any areas of the body. °Document Released: 09/12/2005 Document Revised: 01/27/2014 Document Reviewed: 04/16/2008 °ExitCare® Patient Information ©2015 [employer]. This information is not intended to replace advice given to you by your health care provider. Make sure you discuss any questions you have with your health care provider. ° °

## 2014-07-15 NOTE — ED Notes (Addendum)
Dressed patient; applied back brace over clothing as instructed by [employer]. Attempted to get her up to walk and she could not tolerated a single step. Pain too great for her to stand. She states she cannot get into or out of the car or into house without assistance. Notified Burna MortimerWanda from case management who stated to request [facility]. Will also require [place] [facility] for transport. Requested [facility] be called for transport.

## 2014-07-15 NOTE — H&P (Signed)
[facility] History and Physical  Rachel BodoHowell K Forner ZOX:[id number] DOB: 01/05/1921 DOA: 07/15/2014  Referring physician: ER physician. PCP: Kristian CoveyBURCHETTE,BRUCE W, MD  Chief Complaint: Fall with back pain.  HPI: [name] is a 80 y.o. female history of hypertension and COPD was brought to the ER after patient had a fall and was complaining of low back pain. Patient states she was walking when her legs gave way. Patient did hit her head but did not lose consciousness. In the ER CT head was negative for anything acute and CT lumbar spine showed acute L1 lumbar compression fracture and on call neurosurgeon Dr. Gerlene FeeKritzer was consulted by the ED physician who had recommended TLSO brace. Since patient was finding it very difficult to move around because of pain patient has been admitted for further pain management and may need rehabilitation placement. Patient denies any chest pain or shortness of breath or any palpitations dizziness. Patient has had a previous fall about one week ago.   Review of Systems: As presented in the history of presenting illness, rest negative.  Past Medical History  Diagnosis Date  . 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 Hypercalcemia 04/29/2010  . INSOMNIA, CHRONIC 04/29/2009  . FAMILIAL TREMOR 10/29/2009  . HYPERTENSION 04/29/2009  . Dysuria 10/29/2009   Past Surgical History  Procedure Laterality Date  . Appendectomy    . Abdominal hysterectomy  2001   Social History:  reports that she quit smoking about 14 years ago. She does not have any smokeless tobacco history on file. She reports that she does not drink alcohol or use illicit drugs. Where does patient live home. Can patient participate in ADLs? Yes.  Allergies  Allergen Reactions  . Amoxicillin     Blotches on legs  . Amoxicillin [Amoxicillin]     Family History:  Family History  Problem Relation Age of Onset  . Cancer Other     breast, colon  . Diabetes Other   . Hyperlipidemia Other   .  Hypertension Other       Prior to Admission medications   Medication Sig Start Date End Date Taking? Authorizing Provider  albuterol (PROVENTIL HFA;VENTOLIN HFA) 108 (90 BASE) MCG/ACT inhaler Inhale 1-2 puffs into the lungs every 6 (six) hours as needed for wheezing or shortness of breath.   Yes Historical Provider, MD  calcium-vitamin D (OSCAL) 250-125 MG-UNIT per tablet Take 1 tablet by mouth daily.     Yes Historical Provider, MD  clonazePAM (KLONOPIN) 0.5 MG tablet Take 0.5 mg by mouth at bedtime.   Yes Historical Provider, MD  ferrous sulfate 325 (65 FE) MG tablet Take 325 mg by mouth 2 (two) times daily.     Yes Historical Provider, MD  gabapentin (NEURONTIN) 100 MG capsule Take 200 mg by mouth at bedtime.   Yes Historical Provider, MD  hydrochlorothiazide (HYDRODIURIL) 25 MG tablet TAKE 1 TABLET ONCE A DAY 11/02/13  Yes Kristian CoveyBruce W Burchette, MD  HYDROcodone-acetaminophen (NORCO/VICODIN) 5-325 MG per tablet Take 1-2 tablets by mouth every 6 (six) hours as needed for moderate pain. 07/02/14  Yes Kristian CoveyBruce W Burchette, MD  ibuprofen (ADVIL,MOTRIN) 200 MG tablet Take 200 mg by mouth every 6 (six) hours as needed.   Yes Historical Provider, MD  tiotropium (SPIRIVA) 18 MCG inhalation capsule Place 18 mcg into inhaler and inhale daily.   Yes Historical Provider, MD  HYDROcodone-acetaminophen (NORCO/VICODIN) 5-325 MG per tablet Take 1-2 tablets by mouth every 4 (four) hours as needed for moderate pain or severe pain. 07/15/14  Elson Areas, PA-C  Multiple Vitamins-Minerals (CENTRUM SILVER ULTRA WOMENS) TABS Take by mouth daily.      Historical Provider, MD  Omega-3 Fatty Acids (FISH OIL) 1200 MG CAPS Take by mouth daily.      Historical Provider, MD    Physical Exam: Filed Vitals:   07/15/14 1943 07/15/14 1951 07/15/14 2000 07/15/14 2030  BP: 140/78 140/78 141/65 113/97  Pulse: 88 84 84 82  Temp:  98.2 F (36.8 C)    TempSrc:  Oral    Resp: 26 20    SpO2: 96% 98% 97% 98%     General:   Moderately built and nourished.  Eyes: Anicteric no pallor.  ENT: No discharge from the ears eyes nose mouth.  Neck: No mass felt.  Cardiovascular: S1-S2 heard.  Respiratory: No rhonchi or crepitations.  Abdomen: Soft nontender bowel sounds present. No bloody or rigidity.  Skin:  No rash.  Musculoskeletal: No edema. Pain in the low back on moving lower extremities.  Psychiatric: Appears normal.  Neurologic: Alert. Oriented to time place and person. Moves all extremities.  Labs on Admission:  Basic Metabolic Panel:  Recent Labs Lab 07/15/14 1844  NA 138  K 4.0  CL 98  CO2 29  GLUCOSE 131*  BUN 17  CREATININE 0.67  CALCIUM 9.2   Liver Function Tests: No results found for this basename: AST, ALT, ALKPHOS, BILITOT, PROT, ALBUMIN,  in the last 168 hours No results found for this basename: LIPASE, AMYLASE,  in the last 168 hours No results found for this basename: AMMONIA,  in the last 168 hours CBC:  Recent Labs Lab 07/15/14 1844  WBC 7.5  NEUTROABS 6.6  HGB 9.9*  HCT 29.8*  MCV 91.1  PLT 270   Cardiac Enzymes: No results found for this basename: CKTOTAL, CKMB, CKMBINDEX, TROPONINI,  in the last 168 hours  BNP (last 3 results) No results found for this basename: PROBNP,  in the last 8760 hours CBG: No results found for this basename: GLUCAP,  in the last 168 hours  Radiological Exams on Admission: Dg Thoracic Spine 2 View  07/15/2014   CLINICAL DATA:  Larey Seat today.  Mid and lower back pain.  EXAM: THORACIC SPINE - 2 VIEW; LUMBAR SPINE - COMPLETE 4+ VIEW  COMPARISON:  Lateral chest x-ray 05/12/2011  FINDINGS: Thoracic spine:  Normal alignment of the thoracic vertebral bodies. There are remote compression fractures without definite new/acute compression fracture. No abnormal paraspinal soft tissue swelling. Aortic calcifications are noted.  Lumbar spine: Moderate degenerative lumbar spondylosis with multilevel disc disease and facet disease. There is a superior  endplate depression of L1 which is new since 2012 but of uncertain acuity. No definite pars defects. The visualized bony pelvis is intact. Extensive vascular calcifications are noted.  IMPRESSION: Remote thoracic compression fractures.  No acute fracture.  L1 compression deformity of uncertain age.  It is new since 2012.   Electronically Signed   By: Loralie Champagne M.D.   On: 07/15/2014 10:57   Dg Lumbar Spine Complete  07/15/2014   CLINICAL DATA:  Larey Seat today.  Mid and lower back pain.  EXAM: THORACIC SPINE - 2 VIEW; LUMBAR SPINE - COMPLETE 4+ VIEW  COMPARISON:  Lateral chest x-ray 05/12/2011  FINDINGS: Thoracic spine:  Normal alignment of the thoracic vertebral bodies. There are remote compression fractures without definite new/acute compression fracture. No abnormal paraspinal soft tissue swelling. Aortic calcifications are noted.  Lumbar spine: Moderate degenerative lumbar spondylosis with multilevel disc disease and  facet disease. There is a superior endplate depression of L1 which is new since 2012 but of uncertain acuity. No definite pars defects. The visualized bony pelvis is intact. Extensive vascular calcifications are noted.  IMPRESSION: Remote thoracic compression fractures.  No acute fracture.  L1 compression deformity of uncertain age.  It is new since 2012.   Electronically Signed   By: Loralie ChampagneMark  Gallerani M.D.   On: 07/15/2014 10:57   Ct Head Wo Contrast  07/15/2014   CLINICAL DATA:  Status post fall today.  EXAM: CT HEAD WITHOUT CONTRAST  TECHNIQUE: Contiguous axial images were obtained from the base of the skull through the vertex without intravenous contrast.  COMPARISON:  None.  FINDINGS: There is chronic microvascular ischemic change. No evidence of acute infarction, hemorrhage, mass lesion, mass effect midline shift abnormal extra-axial fluid collection is identified. There is no hydrocephalus or pneumocephalus. The calvarium is intact.  IMPRESSION: No acute finding.  Atrophy and chronic  microvascular ischemic change.   Electronically Signed   By: Drusilla Kannerhomas  Dalessio M.D.   On: 07/15/2014 19:53   Ct Lumbar Spine Wo Contrast  07/15/2014   CLINICAL DATA:  Status post fall, back pain  EXAM: CT LUMBAR SPINE WITHOUT CONTRAST  TECHNIQUE: Multidetector CT imaging of the lumbar spine was performed without intravenous contrast administration. Multiplanar CT image reconstructions were also generated.  COMPARISON:  None.  FINDINGS: The alignment is anatomic. There is an acute L1 vertebral body compression fracture through the superior endplate with approximately 20% depression. There is 4 mm of retropulsion of the superior posterior margin of the L1 vertebral body with mild impression on the ventral thecal sac.  The remainder the vertebral body heights are maintained. There is no acute fracture or static listhesis. The paravertebral soft tissues are normal. The intraspinal soft tissues are not fully imaged on this examination due to poor soft tissue contrast, but there is no gross soft tissue abnormality.  There is degenerative disc disease at L3-4 and L4-5. There is mild bilateral facet arthropathy throughout the lumbar spine.  There is abdominal aortic atherosclerosis.  IMPRESSION: 1. Acute, L1 vertebral body compression fracture through the superior endplate with approximately 20% depression. 4 mm of retropulsion of the superior posterior margin of L1 with mild flattening of the ventral thecal sac.   Electronically Signed   By: Elige KoHetal  Patel   On: 07/15/2014 12:57    EKG: Independently reviewed. Normal sinus rhythm with PR interval prolongation.  Assessment/Plan Principal Problem:   Compression fracture of L1 lumbar vertebra Active Problems:   Essential hypertension   COPD (chronic obstructive pulmonary disease)   1. L1 compression fracture secondary to fall - patient has been placed on TLSO brace as recommended by neurosurgeon. Patient has been placed on pain medications and muscle relaxant  Robaxin. Consult physical therapy and patient may need placement. 2. COPD - presently not wheezing. 3. Hypertension - continue home medications. 4. Chronic anemia - follow CBC and if there is no significant fall in hemoglobin further workup as outpatient.    Code Status: Full code.  Family Communication: None.  Disposition Plan: Admit to inpatient.    Kanijah Groseclose N. [facility] Pager [phone].  If 7PM-7AM, please contact night-coverage [email or web address] Password [email or web address] 07/15/2014, 9:17 PM

## 2014-07-15 NOTE — Progress Notes (Signed)
Clinical Social Work Department BRIEF PSYCHOSOCIAL ASSESSMENT 07/15/2014  Patient:  [name],[name]     Account Number:  [id number]     Admit date:  07/15/2014  Clinical Social Worker:  DRAKE,JODY, LCSW  Date/Time:  07/15/2014 10:21 PM  Referred by:  Physician  Date Referred:  07/15/2014 Referred for  SNF Placement   Other Referral:   Interview type:  Patient Other interview type:   Son and husband, as well. Database review.    PSYCHOSOCIAL DATA Living Status:  HUSBAND Admitted from facility:   Level of care:   Primary support name:  [name] Primary support relationship to patient:  SPOUSE Degree of support available:   Husand provides good emotional support, however he is limited as to how much physical assitance he can provide to pt.    CURRENT CONCERNS Current Concerns  Post-Acute Placement   Other Concerns:   Versus home with HHC.    SOCIAL WORK ASSESSMENT / PLAN CSW met with pt, her husband and son and explained role of CSW/d/c planning.  Pt was d/c this pm from the ED with HHC arranged, but family decided that they could not take her home because she couldn't walk.  Spoke with pt/family re: SNF placement for rehab, but they were reluctant to agree to placement at the time of CSW visit.  Unit CSW to re-visit SNF following PT eval of pt.   Assessment/plan status:  Psychosocial Support/Ongoing Assessment of Needs Other assessment/ plan:   If d/c plan is SNF, CSW will initiate bed search and facilitate NH tx.   Information/referral to community resources:    PATIENT'S/FAMILY'S RESPONSE TO PLAN OF CARE: Pt uncomfortable, writhing in pain in the bed.  Husband/son reserved and did not speak much to CSW.  Family appreciative of visit. Emotional support offered.    

## 2014-07-15 NOTE — ED Notes (Signed)
Social work returned page; on the way to see patient.

## 2014-07-15 NOTE — ED Notes (Signed)
Patient and family are very concerned about going home; they want her to be admitted and are afraid to take her home after how she felt trying to stand up.  Notified Dr. Littie DeedsGentry of their concerns and desire for admission.

## 2014-07-15 NOTE — Care Management (Addendum)
ED CM spoke with ED CSW regarding change of patient status. Patient reevaluated by oncoming EDP,  She is c/o pain unable to stand. Patient is requiring IV pain meds at this time. [facility] has been consulted for possible  admission. Patient and family are agreeable.

## 2014-07-15 NOTE — ED Notes (Signed)
Patient transported to X-ray 

## 2014-07-16 DIAGNOSIS — R251 Tremor, unspecified: Secondary | ICD-10-CM

## 2014-07-16 DIAGNOSIS — J42 Unspecified chronic bronchitis: Secondary | ICD-10-CM

## 2014-07-16 DIAGNOSIS — S32010D Wedge compression fracture of first lumbar vertebra, subsequent encounter for fracture with routine healing: Secondary | ICD-10-CM

## 2014-07-16 LAB — CBC WITH DIFFERENTIAL/PLATELET
BASOS PCT: 1 % (ref 0–1)
Basophils Absolute: 0 10*3/uL (ref 0.0–0.1)
EOS PCT: 1 % (ref 0–5)
Eosinophils Absolute: 0.1 10*3/uL (ref 0.0–0.7)
HEMATOCRIT: 29.1 % — AB (ref 36.0–46.0)
Hemoglobin: 9.4 g/dL — ABNORMAL LOW (ref 12.0–15.0)
LYMPHS PCT: 5 % — AB (ref 12–46)
Lymphs Abs: 0.4 10*3/uL — ABNORMAL LOW (ref 0.7–4.0)
MCH: 30.7 pg (ref 26.0–34.0)
MCHC: 32.3 g/dL (ref 30.0–36.0)
MCV: 95.1 fL (ref 78.0–100.0)
MONO ABS: 0.3 10*3/uL (ref 0.1–1.0)
Monocytes Relative: 4 % (ref 3–12)
Neutro Abs: 6.9 10*3/uL (ref 1.7–7.7)
Neutrophils Relative %: 89 % — ABNORMAL HIGH (ref 43–77)
Platelets: 237 10*3/uL (ref 150–400)
RBC: 3.06 MIL/uL — ABNORMAL LOW (ref 3.87–5.11)
RDW: 14.3 % (ref 11.5–15.5)
WBC: 7.7 10*3/uL (ref 4.0–10.5)

## 2014-07-16 LAB — COMPREHENSIVE METABOLIC PANEL
ALT: 11 U/L (ref 0–35)
AST: 32 U/L (ref 0–37)
Albumin: 2.9 g/dL — ABNORMAL LOW (ref 3.5–5.2)
Alkaline Phosphatase: 87 U/L (ref 39–117)
Anion gap: 12 (ref 5–15)
BILIRUBIN TOTAL: 0.5 mg/dL (ref 0.3–1.2)
BUN: 18 mg/dL (ref 6–23)
CHLORIDE: 98 meq/L (ref 96–112)
CO2: 30 mEq/L (ref 19–32)
Calcium: 8.9 mg/dL (ref 8.4–10.5)
Creatinine, Ser: 0.68 mg/dL (ref 0.50–1.10)
GFR calc Af Amer: 86 mL/min — ABNORMAL LOW (ref >90)
GFR calc non Af Amer: 74 mL/min — ABNORMAL LOW (ref >90)
Glucose, Bld: 107 mg/dL — ABNORMAL HIGH (ref 70–99)
POTASSIUM: 3.9 meq/L (ref 3.7–5.3)
Sodium: 140 mEq/L (ref 137–147)
Total Protein: 6.4 g/dL (ref 6.0–8.3)

## 2014-07-16 MED ORDER — KETOROLAC TROMETHAMINE 15 MG/ML IJ SOLN: 15.0000 mg | Freq: Four times a day (QID) | INTRAMUSCULAR | Status: DC

## 2014-07-16 MED ORDER — KETOROLAC TROMETHAMINE 15 MG/ML IJ SOLN
15.0000 mg | Freq: Four times a day (QID) | INTRAMUSCULAR | Status: AC
  Administered 2014-07-16 – 2014-07-17 (×4): 15 mg via INTRAVENOUS
  Filled 2014-07-16 (×4): qty 1

## 2014-07-16 MED ORDER — KETOROLAC TROMETHAMINE 30 MG/ML IJ SOLN
30.0000 mg | Freq: Once | INTRAMUSCULAR | Status: AC
  Administered 2014-07-16: 30 mg via INTRAVENOUS
  Filled 2014-07-16: qty 1

## 2014-07-16 MED ORDER — HYDROCODONE-ACETAMINOPHEN 5-325 MG PO TABS
1.0000 | ORAL_TABLET | Freq: Four times a day (QID) | ORAL | Status: DC | PRN
  Administered 2014-07-18: 1 via ORAL
  Filled 2014-07-16: qty 1

## 2014-07-16 MED ORDER — ACETAMINOPHEN 325 MG PO TABS
650.0000 mg | ORAL_TABLET | Freq: Three times a day (TID) | ORAL | Status: DC
  Administered 2014-07-16 – 2014-07-18 (×7): 650 mg via ORAL
  Filled 2014-07-16 (×8): qty 2

## 2014-07-16 MED ORDER — HYDROMORPHONE HCL 1 MG/ML IJ SOLN
1.0000 mg | INTRAMUSCULAR | Status: DC | PRN
  Administered 2014-07-17 – 2014-07-18 (×3): 1 mg via INTRAVENOUS
  Filled 2014-07-16 (×3): qty 1

## 2014-07-16 MED ORDER — PANTOPRAZOLE SODIUM 40 MG PO TBEC
40.0000 mg | DELAYED_RELEASE_TABLET | Freq: Every day | ORAL | Status: DC
  Administered 2014-07-16 – 2014-07-18 (×3): 40 mg via ORAL
  Filled 2014-07-16 (×4): qty 1

## 2014-07-16 NOTE — Evaluation (Signed)
Physical Therapy Evaluation Patient Details Name: [name] MRN: [id number] DOB: 04/13/1921 Today's Date: 07/16/2014   History of Present Illness  80 y.o. female [facility] after a fall sustaining a L1 burst fx.  Neurosurgery consulted and treating conservatively with a brace.  Pt with significant PMHx of insomnia, HTN, tremor, and recent fall with left knee injury in the past two months.    Clinical Impression  Pt is mobilizing well considering her painful lumbar compression fracture and equally painful left knee (injury from previous fall).  Pt was able to tolerate OOB to 3-in-1 and then to recliner chair with brace donned.  Limited gait distance due to DOE and pain in back and left leg.  Her husband, although very supportive, cannot physically care for her at discharge. SNF rehab is most appropriate at this time.  PT to follow acutely for deficits listed below.       Follow Up Recommendations SNF    Equipment Recommendations  None recommended by PT    Recommendations for Other Services   NA    Precautions / Restrictions Precautions Precautions: Fall;Back Precaution Comments: reviewed log roll for OOB and brace use/frequency.  Required Braces or Orthoses: Spinal Brace Spinal Brace: Lumbar corset (no orders for how to donn it, donned in sitting)      Mobility  Bed Mobility Overal bed mobility: Needs Assistance Bed Mobility: Sidelying to Sit;Rolling Rolling: Min assist Sidelying to sit: Min assist       General bed mobility comments: Min assist to support trunk during transitions. Verbal cues for log roll to protect the back.   Transfers Overall transfer level: Needs assistance Equipment used: Rolling walker (2 wheeled) Transfers: Sit to/from [employer]Stand;Stand Pivot Transfers Sit to Stand: Min assist Stand pivot transfers: Min assist       General transfer comment: Min assist to support trunk to get to standing and pivot [facility].     Ambulation/Gait Ambulation/Gait assistance: Min assist Ambulation Distance (Feet): 8 Feet Assistive device: Rolling walker (2 wheeled) Gait Pattern/deviations: Step-to pattern;Antalgic;Trunk flexed Gait velocity: decreased   General Gait Details: Pt with very antalgic gait pattern stepping with left toe pointed, knee flexed and very little weight shift to that side.  Pt also reporting back pain, but I don't believe it is as severe as the left knee pain.              Balance Overall balance assessment: Needs assistance;History of Falls Sitting-balance support: Feet supported;No upper extremity supported Sitting balance-Leahy Scale: Good     Standing balance support: Bilateral upper extremity supported Standing balance-Leahy Scale: Poor Standing balance comment: Pt needs external assist in standing.                              Pertinent Vitals/Pain Pain Assessment: Faces Faces Pain Scale: Hurts a little bit    Home Living Family/patient expects to be discharged to:: Private residence Living Arrangements: Spouse/significant other Available Help at Discharge: Family;Available 24 hours/day (husband, 24/7 supervision only) Type of Home: House Home Access: Stairs to enter Entrance Stairs-Rails: None Entrance Stairs-Number of Steps: 1 Home Layout: Two level;Full bath on main level;Able to live on main level with bedroom/bathroom (husband stays upstairs) Home Equipment: Walker - standard;Shower seat;Bedside commode;Other (comment) (BSC over the toilet seat, tub shower) Additional Comments: wears O2 PRN at home "almost all the time" per husband.     Prior Function  Level of Independence: Needs assistance   Gait / Transfers Assistance Needed: walks with Std walker x 3 weeks now.   ADL's / Homemaking Assistance Needed: did cook, clean and laundry until her first fall three weeks ago  Comments: has been using the walker for 3 weeks.  This is her second fall in 2  months.  Messed up herL knee falling the first time     Hand Dominance   Dominant Hand: Right    Extremity/Trunk Assessment   Upper Extremity Assessment: Generalized weakness           Lower Extremity Assessment: LLE deficits/detail   LLE Deficits / Details: both legs are generally weak, but left leg is significantly worse due to recent non-surgical left knee injury after fall two months ago.  Pt with very antalgic gait pattern and is  keeping the knee flexed in stance (almost like touch down weight bearing).   Cervical / Trunk Assessment: Kyphotic  Communication   Communication: HOH  Cognition Arousal/Alertness: Awake/alert Behavior During Therapy: WFL for tasks assessed/performed Overall Cognitive Status: Within Functional Limits for tasks assessed                      General Comments General comments (skin integrity, edema, etc.): Pt with significant increase in DOE with mobility.  DOE 3-4/4 with O2 2 L Warm [place] maintained entire session.  O2 sats during mobility were 83-85% and took ~3 mins to get back above 90% with increase to 3 L after getting in recliner chair and resting.  Pt bent forward on the commode.  I educated her on how this was compressing her lungs making it harder to breathe and also not good for her back.            Assessment/Plan    PT Assessment Patient needs continued PT services  PT Diagnosis Difficulty walking;Abnormality of gait;Generalized weakness;Acute pain   PT Problem List Decreased strength;Decreased activity tolerance;Decreased balance;Decreased mobility;Decreased knowledge of use of DME;Decreased knowledge of precautions;Pain;Cardiopulmonary status limiting activity  PT Treatment Interventions DME instruction;Gait training;Functional mobility training;Therapeutic activities;Therapeutic exercise;Balance training;Neuromuscular re-education;Patient/family education;Modalities   PT Goals (Current goals can be found in the Care Plan section)  Acute Rehab PT Goals Patient Stated Goal: to get better so she can go home PT Goal Formulation: With patient/family Time For Goal Achievement: 07/30/14 Potential to Achieve Goals: Good    Frequency Min 3X/week   Barriers to discharge Decreased caregiver support pt's elderly husband cannot provide her with the physical assist she needs.        End of Session Equipment Utilized During Treatment: Back brace Activity Tolerance: Patient limited by fatigue;Patient limited by pain Patient left: in chair;with call bell/phone within reach;with family/visitor present           Time: 1516-1540 (and earlier, taking hx 11:02-11:17am) PT Time Calculation (min): 24 min   Charges:   PT Evaluation $Initial PT Evaluation Tier I: 1 Procedure PT Treatments $Therapeutic Activity: 23-37 mins        Orva Riles B. Jaely Silman, PT, DPT [phone]   07/16/2014, 4:20 PM

## 2014-07-16 NOTE — Progress Notes (Signed)
Patient ID: [name]  female  [id number]    DOB: 07/06/1921    DOA: 07/15/2014  PCP: Kristian CoveyBURCHETTE,BRUCE W, MD  Brief history of present illness  Patient is a 80 year old female with history of hypertension, COPD was brought to the ER after patient had a fall and complaining of low back pain. CT head was negative however CT lumbar spine showed acute L1 lumbar compression fracture. Neurosurgery consulted and recommended TLSO brace.  Assessment/Plan: Principal Problem:   Compression fracture of L1 lumbar vertebra - Continue TLSO brace, increase ambulation, PT OT eval - Neurosurgery following  Acute on chronic back pain - Placed on scheduled Tylenol, Toradol, Robaxin, Norco    Essential hypertension Stable    COPD (chronic obstructive pulmonary disease) - Currently stable  DVT Prophylaxis:  Code Status:Full CODE STATUS   Family Communication:  Disposition:  Consultants:  Neurosurgery   Procedures: None  Antibiotics:  None    Subjective: Patient seen and examined, uncomfortable due to back pain, tearful   Objective: Weight change:   Intake/Output Summary (Last 24 hours) at 07/16/14 1143 Last data filed at 07/16/14 0356  Gross per 24 hour  Intake    180 ml  Output      0 ml  Net    180 ml   Blood pressure 130/65, pulse 95, temperature 98.1 F (36.7 C), temperature source Oral, resp. rate 18, height 5\' 6"  (1.676 m), weight 53.434 kg (117 lb 12.8 oz), SpO2 97.00%.  Physical Exam: General: Alert and awake, oriented x3,  uncomfortable . CVS: S1-S2 clear, no murmur rubs or gallops Chest: clear to auscultation bilaterally, no wheezing, rales or rhonchi Abdomen: soft nontender, nondistended, normal bowel sounds  Extremities: no cyanosis, clubbing or edema noted bilaterally Neuro: Cranial nerves II-XII intact, no focal neurological deficits  Lab Results: Basic Metabolic Panel:  Recent Labs Lab 07/15/14 1844 07/16/14 0504  NA 138 140  K 4.0 3.9    CL 98 98  CO2 29 30  GLUCOSE 131* 107*  BUN 17 18  CREATININE 0.67 0.68  CALCIUM 9.2 8.9   Liver Function Tests:  Recent Labs Lab 07/16/14 0504  AST 32  ALT 11  ALKPHOS 87  BILITOT 0.5  PROT 6.4  ALBUMIN 2.9*   No results found for this basename: LIPASE, AMYLASE,  in the last 168 hours No results found for this basename: AMMONIA,  in the last 168 hours CBC:  Recent Labs Lab 07/15/14 1844 07/16/14 0504  WBC 7.5 7.7  NEUTROABS 6.6 6.9  HGB 9.9* 9.4*  HCT 29.8* 29.1*  MCV 91.1 95.1  PLT 270 237   Cardiac Enzymes: No results found for this basename: CKTOTAL, CKMB, CKMBINDEX, TROPONINI,  in the last 168 hours BNP: No components found with this basename: POCBNP,  CBG: No results found for this basename: GLUCAP,  in the last 168 hours   Micro Results: No results found for this or any previous visit (from the past 240 hour(s)).  Studies/Results: Dg Thoracic Spine 2 View  07/15/2014   CLINICAL DATA:  Larey SeatFell today.  Mid and lower back pain.  EXAM: THORACIC SPINE - 2 VIEW; LUMBAR SPINE - COMPLETE 4+ VIEW  COMPARISON:  Lateral chest x-ray 05/12/2011  FINDINGS: Thoracic spine:  Normal alignment of the thoracic vertebral bodies. There are remote compression fractures without definite new/acute compression fracture. No abnormal paraspinal soft tissue swelling. Aortic calcifications are noted.  Lumbar spine: Moderate degenerative lumbar spondylosis with multilevel disc disease and facet disease. There is a  superior endplate depression of L1 which is new since 2012 but of uncertain acuity. No definite pars defects. The visualized bony pelvis is intact. Extensive vascular calcifications are noted.  IMPRESSION: Remote thoracic compression fractures.  No acute fracture.  L1 compression deformity of uncertain age.  It is new since 2012.   Electronically Signed   By: Loralie Champagne M.D.   On: 07/15/2014 10:57   Dg Lumbar Spine Complete  07/15/2014   CLINICAL DATA:  Larey Seat today.  Mid and  lower back pain.  EXAM: THORACIC SPINE - 2 VIEW; LUMBAR SPINE - COMPLETE 4+ VIEW  COMPARISON:  Lateral chest x-ray 05/12/2011  FINDINGS: Thoracic spine:  Normal alignment of the thoracic vertebral bodies. There are remote compression fractures without definite new/acute compression fracture. No abnormal paraspinal soft tissue swelling. Aortic calcifications are noted.  Lumbar spine: Moderate degenerative lumbar spondylosis with multilevel disc disease and facet disease. There is a superior endplate depression of L1 which is new since 2012 but of uncertain acuity. No definite pars defects. The visualized bony pelvis is intact. Extensive vascular calcifications are noted.  IMPRESSION: Remote thoracic compression fractures.  No acute fracture.  L1 compression deformity of uncertain age.  It is new since 2012.   Electronically Signed   By: Loralie Champagne M.D.   On: 07/15/2014 10:57   Dg Femur Left  07/03/2014   CLINICAL DATA:  Larey Seat today, pain at mid to distal LEFT femur and LEFT knee  EXAM: LEFT FEMUR - 2 VIEW  COMPARISON:  LEFT knee radiographs 06/26/2014  FINDINGS: Diffuse osseous demineralization.  Mild narrowing of LEFT knee joint.  LEFT hip joint space preserved.  Suspicion of nondisplaced fracture of the inferior pole of the LEFT patella.  Femur appears intact.  Minimal suprapatellar soft tissue swelling on lateral view.  Due to obliquity of the knee on lateral view, unable adequately assess for knee joint effusion.  Extensive atherosclerotic calcifications.  IMPRESSION: Suspected nondisplaced fracture at inferior pole of LEFT patella.   Electronically Signed   By: Ulyses Southward M.D.   On: 07/03/2014 08:11   Ct Head Wo Contrast  07/15/2014   CLINICAL DATA:  Status post fall today.  EXAM: CT HEAD WITHOUT CONTRAST  TECHNIQUE: Contiguous axial images were obtained from the base of the skull through the vertex without intravenous contrast.  COMPARISON:  None.  FINDINGS: There is chronic microvascular ischemic  change. No evidence of acute infarction, hemorrhage, mass lesion, mass effect midline shift abnormal extra-axial fluid collection is identified. There is no hydrocephalus or pneumocephalus. The calvarium is intact.  IMPRESSION: No acute finding.  Atrophy and chronic microvascular ischemic change.   Electronically Signed   By: Drusilla Kanner M.D.   On: 07/15/2014 19:53   Ct Lumbar Spine Wo Contrast  07/15/2014   CLINICAL DATA:  Status post fall, back pain  EXAM: CT LUMBAR SPINE WITHOUT CONTRAST  TECHNIQUE: Multidetector CT imaging of the lumbar spine was performed without intravenous contrast administration. Multiplanar CT image reconstructions were also generated.  COMPARISON:  None.  FINDINGS: The alignment is anatomic. There is an acute L1 vertebral body compression fracture through the superior endplate with approximately 20% depression. There is 4 mm of retropulsion of the superior posterior margin of the L1 vertebral body with mild impression on the ventral thecal sac.  The remainder the vertebral body heights are maintained. There is no acute fracture or static listhesis. The paravertebral soft tissues are normal. The intraspinal soft tissues are not fully imaged on this  examination due to poor soft tissue contrast, but there is no gross soft tissue abnormality.  There is degenerative disc disease at L3-4 and L4-5. There is mild bilateral facet arthropathy throughout the lumbar spine.  There is abdominal aortic atherosclerosis.  IMPRESSION: 1. Acute, L1 vertebral body compression fracture through the superior endplate with approximately 20% depression. 4 mm of retropulsion of the superior posterior margin of L1 with mild flattening of the ventral thecal sac.   Electronically Signed   By: Elige KoHetal  Patel   On: 07/15/2014 12:57   Dg Knee Complete 4 Views Left  07/03/2014   CLINICAL DATA:  Left knee pain after fall.  Initial encounter.  EXAM: LEFT KNEE - COMPLETE 4+ VIEW  COMPARISON:  06/26/2014  FINDINGS:  Probable small suprapatellar joint effusion. Lucency and uplifting at the tibial tuberosity is unchanged from 06/26/2014 and likely chronic. No acute fracture identified. No significant degenerative change for age. Diffuse atherosclerotic calcification.  IMPRESSION: 1. Stable exam from 06/26/2014.  No acute osseous findings. 2. Probable small knee joint effusion.   Electronically Signed   By: Tiburcio PeaJonathan  Watts M.D.   On: 07/03/2014 08:28    Medications: Scheduled Meds: . acetaminophen  650 mg Oral TID  . calcium-vitamin D  1 tablet Oral Daily  . clonazePAM  0.5 mg Oral QHS  . enoxaparin (LOVENOX) injection  40 mg Subcutaneous Q24H  . ferrous sulfate  325 mg Oral BID  . gabapentin  200 mg Oral QHS  . hydrochlorothiazide  25 mg Oral Daily  . ketorolac  15 mg Intravenous 4 times per day  . pantoprazole  40 mg Oral Daily  . sodium chloride  3 mL Intravenous Q12H  . tiotropium  18 mcg Inhalation Daily      LOS: 1 day   RAI,RIPUDEEP M.D. [facility] 07/16/2014, 11:43 AM Pager: [phone]  If 7PM-7AM, please contact night-coverage [email or web address] Password [email or web address]

## 2014-07-16 NOTE — Progress Notes (Signed)
[facility] Progress Note Patient Details:  [name] 01/05/1921 [id number] Called order in to [employer]. Patient ID: [name], female   DOB: 03/08/1921, 80 y.o.   MRN: [id number]   [name], [name] 07/16/2014, 9:46 AM

## 2014-07-16 NOTE — Progress Notes (Signed)
PT Cancellation Note  Patient Details Name: [name] K Petitti MRN: [id number] DOB: 10/16/1920   Cancelled Treatment:    Reason Eval/Treat Not Completed: Other (comment).  Orders and notes from ED state pt to be in TLSO brace.  Thoracic brace in room.  I spoke with [employer] who are confident that the brace delivered will stabilize the area intended.  I wanted to confirm with Dr. Gerlene FeeKritzer who consulted in the ED that this was ok.  I called the office and spoke with his RN Leanne and she confirmed that TLSO was fine or lumbar corsett was ok as well.  She is going to contact [employer] with new order.  PT to await new brace arrival.  Thanks,    Lurena JoinerRebecca B. Travonte Byard, PT, DPT [phone]   07/16/2014, 11:39 AM

## 2014-07-16 NOTE — Progress Notes (Addendum)
Received patient to 4 North floor from ED.  Alert and oriented x 2 self and place. Patient to floor without back brace transporting nurse states patient's spouse may have taken it home earlier in the day on 07/15/2014.  Received at 2230 pm.

## 2014-07-16 NOTE — Clinical Social Work Psychosocial (Signed)
Clinical Education officer, museum met with pt and pt's husband and son at bedside in reference to post-acute placement for SNF. Pt expressed she would rather return home with Home Health. Pt's husband explained to pt she should be placed in SNF and then return home due to his inability to care for her at home right away. Pt agreed with husband and pt's husband stated he would like for pt to be placed at [facility]. CSW explained SNF process and provided pt and pt's family with SNF list.  Pt lying bed, alert and oriented. Pt and pt's family agreeable to SNF.   CSW to follow pt to provide continued support and facilitate discharge needs once patient is medically stable.    Glendon Axe, MSW, LCSWA [phone] 07/16/2014 4:14 PM

## 2014-07-16 NOTE — Progress Notes (Signed)
UR complete.  Yoland Scherr RN, MSN 

## 2014-07-17 MED ORDER — KETOROLAC TROMETHAMINE 15 MG/ML IJ SOLN
15.0000 mg | Freq: Four times a day (QID) | INTRAMUSCULAR | Status: AC
  Administered 2014-07-17 – 2014-07-18 (×3): 15 mg via INTRAVENOUS
  Filled 2014-07-17 (×3): qty 1

## 2014-07-17 NOTE — Progress Notes (Signed)
[facility]. Clinical Social Worker spoke with facility for confirmation and will continue to provide support and facilitate pt's discharge needs once medically stable.   Derenda FennelBashira Jackey Housey, MSW, LCSWA [phone] 07/17/2014 2:09 PM

## 2014-07-17 NOTE — Progress Notes (Signed)
Clinical Social Worker presented bed offers to pt and pt's husband and son. Pt's husband now desires placement at [facility]. Pt's husband reported [facility] being closer to pt's home. CSW contacted [facility] for pending bed, facility will keep CSW updated on pending nurse evaluation/ bed availability. CSW remains available for continued support and to facilitate pt's discharge needs once medically stable.   Taraneh Metheney, MSW, LCSWA [phone] 07/17/2014 10:54 AM  

## 2014-07-17 NOTE — Progress Notes (Signed)
Patient ID: [name]  female  GNF:[id number]    DOB: 10/14/1920    DOA: 07/15/2014  PCP: Kristian CoveyBURCHETTE,BRUCE W, MD  Brief history of present illness  Patient is a 80 year old female with history of hypertension, COPD was brought to the ER after patient had a fall and complaining of low back pain. CT head was negative however CT lumbar spine showed acute L1 lumbar compression fracture. Neurosurgery consulted and recommended TLSO brace.  Assessment/Plan: Principal Problem:   Compression fracture of L1 lumbar vertebra - Continue TLSO brace, increase ambulation,  - PT OT eval recommending skilled nursing facility - Neurosurgery following  Acute on chronic back pain improving somewhat - Placed on scheduled Tylenol, Toradol, Robaxin, Norco - Will continue toradol for today, increase ambulation    Essential hypertension Stable    COPD (chronic obstructive pulmonary disease) - Currently stable  DVT Prophylaxis:  Code Status:Full CODE STATUS   Family Communication:  Disposition:  Consultants:  Neurosurgery   Procedures: None  Antibiotics:  None    Subjective: Patient seen and examined, feeling somewhat better today, resistant to get up and work with PT  Objective: Weight change:   Intake/Output Summary (Last 24 hours) at 07/17/14 1129 Last data filed at 07/17/14 0830  Gross per 24 hour  Intake    600 ml  Output      0 ml  Net    600 ml   Blood pressure 111/48, pulse 90, temperature 98.8 F (37.1 C), temperature source Oral, resp. rate 16, height 5\' 6"  (1.676 m), weight 53.434 kg (117 lb 12.8 oz), SpO2 87.00%.  Physical Exam: General: Alert and awake, oriented x3 CVS: S1-S2 clear, no murmur rubs or gallops Chest: clear to auscultation bilaterally, no wheezing, rales or rhonchi Abdomen: soft nontender, nondistended, normal bowel sounds  Extremities: no cyanosis, clubbing or edema noted bilaterally   Lab Results: Basic Metabolic Panel:  Recent Labs Lab  07/15/14 1844 07/16/14 0504  NA 138 140  K 4.0 3.9  CL 98 98  CO2 29 30  GLUCOSE 131* 107*  BUN 17 18  CREATININE 0.67 0.68  CALCIUM 9.2 8.9   Liver Function Tests:  Recent Labs Lab 07/16/14 0504  AST 32  ALT 11  ALKPHOS 87  BILITOT 0.5  PROT 6.4  ALBUMIN 2.9*   No results found for this basename: LIPASE, AMYLASE,  in the last 168 hours No results found for this basename: AMMONIA,  in the last 168 hours CBC:  Recent Labs Lab 07/15/14 1844 07/16/14 0504  WBC 7.5 7.7  NEUTROABS 6.6 6.9  HGB 9.9* 9.4*  HCT 29.8* 29.1*  MCV 91.1 95.1  PLT 270 237   Cardiac Enzymes: No results found for this basename: CKTOTAL, CKMB, CKMBINDEX, TROPONINI,  in the last 168 hours BNP: No components found with this basename: POCBNP,  CBG: No results found for this basename: GLUCAP,  in the last 168 hours   Micro Results: No results found for this or any previous visit (from the past 240 hour(s)).  Studies/Results: Dg Thoracic Spine 2 View  07/15/2014   CLINICAL DATA:  Larey SeatFell today.  Mid and lower back pain.  EXAM: THORACIC SPINE - 2 VIEW; LUMBAR SPINE - COMPLETE 4+ VIEW  COMPARISON:  Lateral chest x-ray 05/12/2011  FINDINGS: Thoracic spine:  Normal alignment of the thoracic vertebral bodies. There are remote compression fractures without definite new/acute compression fracture. No abnormal paraspinal soft tissue swelling. Aortic calcifications are noted.  Lumbar spine: Moderate degenerative lumbar spondylosis with  multilevel disc disease and facet disease. There is a superior endplate depression of L1 which is new since 2012 but of uncertain acuity. No definite pars defects. The visualized bony pelvis is intact. Extensive vascular calcifications are noted.  IMPRESSION: Remote thoracic compression fractures.  No acute fracture.  L1 compression deformity of uncertain age.  It is new since 2012.   Electronically Signed   By: Loralie ChampagneMark  Gallerani M.D.   On: 07/15/2014 10:57   Dg Lumbar Spine  Complete  07/15/2014   CLINICAL DATA:  Larey SeatFell today.  Mid and lower back pain.  EXAM: THORACIC SPINE - 2 VIEW; LUMBAR SPINE - COMPLETE 4+ VIEW  COMPARISON:  Lateral chest x-ray 05/12/2011  FINDINGS: Thoracic spine:  Normal alignment of the thoracic vertebral bodies. There are remote compression fractures without definite new/acute compression fracture. No abnormal paraspinal soft tissue swelling. Aortic calcifications are noted.  Lumbar spine: Moderate degenerative lumbar spondylosis with multilevel disc disease and facet disease. There is a superior endplate depression of L1 which is new since 2012 but of uncertain acuity. No definite pars defects. The visualized bony pelvis is intact. Extensive vascular calcifications are noted.  IMPRESSION: Remote thoracic compression fractures.  No acute fracture.  L1 compression deformity of uncertain age.  It is new since 2012.   Electronically Signed   By: Loralie ChampagneMark  Gallerani M.D.   On: 07/15/2014 10:57   Dg Femur Left  07/03/2014   CLINICAL DATA:  Larey SeatFell today, pain at mid to distal LEFT femur and LEFT knee  EXAM: LEFT FEMUR - 2 VIEW  COMPARISON:  LEFT knee radiographs 06/26/2014  FINDINGS: Diffuse osseous demineralization.  Mild narrowing of LEFT knee joint.  LEFT hip joint space preserved.  Suspicion of nondisplaced fracture of the inferior pole of the LEFT patella.  Femur appears intact.  Minimal suprapatellar soft tissue swelling on lateral view.  Due to obliquity of the knee on lateral view, unable adequately assess for knee joint effusion.  Extensive atherosclerotic calcifications.  IMPRESSION: Suspected nondisplaced fracture at inferior pole of LEFT patella.   Electronically Signed   By: Ulyses SouthwardMark  Boles M.D.   On: 07/03/2014 08:11   Ct Head Wo Contrast  07/15/2014   CLINICAL DATA:  Status post fall today.  EXAM: CT HEAD WITHOUT CONTRAST  TECHNIQUE: Contiguous axial images were obtained from the base of the skull through the vertex without intravenous contrast.   COMPARISON:  None.  FINDINGS: There is chronic microvascular ischemic change. No evidence of acute infarction, hemorrhage, mass lesion, mass effect midline shift abnormal extra-axial fluid collection is identified. There is no hydrocephalus or pneumocephalus. The calvarium is intact.  IMPRESSION: No acute finding.  Atrophy and chronic microvascular ischemic change.   Electronically Signed   By: Drusilla Kannerhomas  Dalessio M.D.   On: 07/15/2014 19:53   Ct Lumbar Spine Wo Contrast  07/15/2014   CLINICAL DATA:  Status post fall, back pain  EXAM: CT LUMBAR SPINE WITHOUT CONTRAST  TECHNIQUE: Multidetector CT imaging of the lumbar spine was performed without intravenous contrast administration. Multiplanar CT image reconstructions were also generated.  COMPARISON:  None.  FINDINGS: The alignment is anatomic. There is an acute L1 vertebral body compression fracture through the superior endplate with approximately 20% depression. There is 4 mm of retropulsion of the superior posterior margin of the L1 vertebral body with mild impression on the ventral thecal sac.  The remainder the vertebral body heights are maintained. There is no acute fracture or static listhesis. The paravertebral soft tissues are normal. The  intraspinal soft tissues are not fully imaged on this examination due to poor soft tissue contrast, but there is no gross soft tissue abnormality.  There is degenerative disc disease at L3-4 and L4-5. There is mild bilateral facet arthropathy throughout the lumbar spine.  There is abdominal aortic atherosclerosis.  IMPRESSION: 1. Acute, L1 vertebral body compression fracture through the superior endplate with approximately 20% depression. 4 mm of retropulsion of the superior posterior margin of L1 with mild flattening of the ventral thecal sac.   Electronically Signed   By: Elige Ko   On: 07/15/2014 12:57   Dg Knee Complete 4 Views Left  07/03/2014   CLINICAL DATA:  Left knee pain after fall.  Initial encounter.   EXAM: LEFT KNEE - COMPLETE 4+ VIEW  COMPARISON:  06/26/2014  FINDINGS: Probable small suprapatellar joint effusion. Lucency and uplifting at the tibial tuberosity is unchanged from 06/26/2014 and likely chronic. No acute fracture identified. No significant degenerative change for age. Diffuse atherosclerotic calcification.  IMPRESSION: 1. Stable exam from 06/26/2014.  No acute osseous findings. 2. Probable small knee joint effusion.   Electronically Signed   By: Tiburcio Pea M.D.   On: 07/03/2014 08:28    Medications: Scheduled Meds: . acetaminophen  650 mg Oral TID  . calcium-vitamin D  1 tablet Oral Daily  . clonazePAM  0.5 mg Oral QHS  . enoxaparin (LOVENOX) injection  40 mg Subcutaneous Q24H  . ferrous sulfate  325 mg Oral BID  . gabapentin  200 mg Oral QHS  . hydrochlorothiazide  25 mg Oral Daily  . pantoprazole  40 mg Oral Daily  . sodium chloride  3 mL Intravenous Q12H  . tiotropium  18 mcg Inhalation Daily      LOS: 2 days   Dorean Daniello M.D. [facility] 07/17/2014, 11:29 AM Pager: [phone]  If 7PM-7AM, please contact night-coverage [email or web address] Password [email or web address]

## 2014-07-17 NOTE — Clinical Social Work Placement (Addendum)
Clinical Social Work Department CLINICAL SOCIAL WORK PLACEMENT NOTE 07/17/2014  Patient:  Ella BodoUTTLE,Dekisha K  Account Number:  [id number] Admit date:  07/15/2014  Clinical Social Worker:  Derenda FennelBASHIRA Fermon Ureta, CLINICAL SOCIAL WORKER  Date/time:  07/17/2014 08:09 AM  Clinical Social Work is seeking post-discharge placement for this patient at the following level of care:   SKILLED NURSING   (*CSW will update this form in Epic as items are completed)   07/16/2014  Patient/family provided with [facility] of Clinical Social Work's list of facilities offering this level of care within the geographic area requested by the patient (or if unable, by the patient's family).  07/16/2014  Patient/family informed of their freedom to choose among providers that offer the needed level of care, that participate in Medicare, Medicaid or managed care program needed by the patient, have an available bed and are willing to accept the patient.  07/16/2014  Patient/family informed of [facility]' ownership interest in [facility], as well as of the fact that they are under no obligation to receive care at this facility.  PASARR submitted to EDS on 07/16/2014 PASARR number received on 07/16/2014  FL2 transmitted to all facilities in geographic area requested by pt/family on  07/16/2014 FL2 transmitted to all facilities within larger geographic area on   Patient informed that his/her managed care company has contracts with or will negotiate with  certain facilities, including the following:   Yes     Patient/family informed of bed offers received:  07/17/2014 [facility]  Physician recommends and patient chooses bed at    [facility]   on   Patient to be transferred to facility by  Patient and family notified of transfer on  Name of family member notified:    The  following physician request were entered in Epic:   Additional Comments:   Derenda FennelBashira Ranay Ketter, MSW, LCSWA [phone] 07/17/2014 8:10 AM

## 2014-07-18 DIAGNOSIS — R609 Edema, unspecified: Secondary | ICD-10-CM

## 2014-07-18 MED ORDER — ACETAMINOPHEN 325 MG PO TABS: 650.0000 mg | ORAL_TABLET | Freq: Three times a day (TID) | ORAL | Status: AC

## 2014-07-18 MED ORDER — HYDROCODONE-ACETAMINOPHEN 5-325 MG PO TABS: 1.0000 | ORAL_TABLET | Freq: Four times a day (QID) | ORAL | Status: AC | PRN

## 2014-07-18 MED ORDER — METHOCARBAMOL 500 MG PO TABS: 500.0000 mg | ORAL_TABLET | Freq: Three times a day (TID) | ORAL | Status: AC | PRN

## 2014-07-18 MED ORDER — DOCUSATE SODIUM 100 MG PO CAPS: 100.0000 mg | ORAL_CAPSULE | Freq: Two times a day (BID) | ORAL | Status: AC

## 2014-07-18 MED ORDER — PANTOPRAZOLE SODIUM 40 MG PO TBEC: 40.0000 mg | DELAYED_RELEASE_TABLET | Freq: Every day | ORAL | Status: AC

## 2014-07-18 MED ORDER — POLYETHYLENE GLYCOL 3350 17 G PO PACK: 17.0000 g | PACK | Freq: Every day | ORAL | Status: AC | PRN

## 2014-07-18 NOTE — Clinical Social Work Note (Signed)
Clinical Social Worker facilitated patient discharge including contacting patient family and facility to confirm patient discharge plans.  Clinical information faxed to facility and family agreeable with plan.  [facility]  RN to call report prior to discharge.  Clinical Social Worker will sign off for now as social work intervention is no longer needed. Please consult us again if new need arises.  Derenda FennelBashira Nesreen Albano, [place] [phone]

## 2014-07-18 NOTE — Evaluation (Signed)
Clinical/Bedside Swallow Evaluation Patient Details  Name: [name] K Nash MRN: [id number] Date of Birth: 07/14/1921  Today's Date: 07/18/2014 Time: [id number] SLP Time Calculation (min): 25 min  Past Medical History:  Past Medical History  Diagnosis Date  . 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 Hypercalcemia 04/29/2010  . INSOMNIA, CHRONIC 04/29/2009  . FAMILIAL TREMOR 10/29/2009  . HYPERTENSION 04/29/2009  . Dysuria 10/29/2009   Past Surgical History:  Past Surgical History  Procedure Laterality Date  . Appendectomy    . Abdominal hysterectomy  2001   HPI:  80 y.o. female [facility] after a fall sustaining a L1 burst fx.  Neurosurgery consulted and treating conservatively with a brace.  Pt with significant PMHx of insomnia, HTN, tremor, and recent fall with left knee injury in the past two months.  Patient with recent c/o of difficulty swallowing and family concerned that she is choking.   Assessment / Plan / Recommendation Clinical Impression  Patient presents with a mild oropharyngeal dysphagia characterized by decreased mastication and bolus transit with hard solids, and patient c/o sensation of food sticking in throat until she has swallowed 2 times. Patient's dysphagia is impacted by a likely esophageal component, as patient was observed to belch several times after taking only a few sips of liquids and a couple bites of puree and hard solids. Patient denied any h/o GERD/reflux.     Aspiration Risk  Mild (secondary to decreased respiratory status)   Diet Recommendation Dysphagia 3 (Mechanical Soft)   Liquid Administration via: Cup Medication Administration: Whole meds with liquid Supervision: Patient able to self feed Compensations: Slow rate;Small sips/bites Postural Changes and/or Swallow Maneuvers: Seated upright 90 degrees;Upright 30-60 min after meal    Other  Recommendations Recommended Consults: Consider GI evaluation (MD consider PPI for GERD symptoms)    Follow Up Recommendations  None    Frequency and Duration   N/A     Pertinent Vitals/Pain     SLP Swallow Goals  N/A   Swallow Study Prior Functional Status       General Date of Onset: 07/18/14 HPI: 80 y.o. [facility] after a fall sustaining a L1 burst fx.  Neurosurgery consulted and treating conservatively with a brace.  Pt with significant PMHx of insomnia, HTN, tremor, and recent fall with left knee injury in the past two months.  Patient with recent c/o of difficulty swallowing and family concerned that she is choking. Type of Study: Bedside swallow evaluation Previous Swallow Assessment: N/A Diet Prior to this Study: Regular;Thin liquids Temperature Spikes Noted: No Respiratory Status: Nasal cannula History of Recent Intubation: No Behavior/Cognition: Alert;Cooperative;Hard of hearing (anxious) Oral Cavity - Dentition: Adequate natural dentition Self-Feeding Abilities: Able to feed self Patient Positioning: Upright in chair Baseline Vocal Quality: Clear Volitional Cough: Strong Volitional Swallow: Able to elicit    Oral/Motor/Sensory Function Overall Oral Motor/Sensory Function: Appears within functional limits for tasks assessed   Ice Chips Ice chips: Not tested   Thin Liquid Thin Liquid: Within functional limits Presentation: Cup;Straw;Self Fed Other Comments: Timely swallow initiation and good laryngeal elevation per palpation. Patient independently swallowed 2 times with one sip, and although no overt s/s aspiration, she stated that it took longer for liquid to fully go down    Nectar Thick Nectar Thick Liquid: Not tested   Honey Thick Honey Thick Liquid: Not tested   Puree Puree: Within functional limits Presentation: Self Fed;Spoon Other Comments: No overt s/s aspiration. Patient swallowed twice and after second  swallow would say "there, now it went down"   Solid   GO    Solid: Impaired Oral Phase Impairments: Impaired  mastication Pharyngeal Phase Impairments: Multiple swallows Other Comments: Patient exhibited decreased efficiency and timeliness with mastication and minimal increased WOB       Elio ForgetPreston, Tejah Brekke Tarrell 07/18/2014,3:40 PM  Angela NevinJohn T. Lonn Im, MA, CCC-SLP [facility] Speech-Language Pathologist

## 2014-07-18 NOTE — Progress Notes (Signed)
Physical Therapy Treatment Patient Details Name: [name] MRN: [id number] DOB: 04/12/1921 Today's Date: 07/18/2014    History of Present Illness 80 y.o. female [facility] after a fall sustaining a L1 burst fx.  Neurosurgery consulted and treating conservatively with a brace.  Pt with significant PMHx of insomnia, HTN, tremor, and recent fall with left knee injury in the past two months.      PT Comments    Pt continues to be limited by back pain, left knee pain, and increased DOE with mobility.  She continues to be at min assist level overall, but due to pain and breathing difficulty she is very slow to progress.  She lives with her husband who is also in his 4790s and he is unable to physically care for her at this time.  SNF for rehab continues to be appropriate.  Follow Up Recommendations  SNF     Equipment Recommendations  None recommended by PT    Recommendations for Other Services   NA     Precautions / Restrictions Precautions Precautions: Fall;Back Precaution Comments: reviewed log roll for OOB.  Required Braces or Orthoses: Spinal Brace (MD ok'd corsett) Spinal Brace: Lumbar corset (no orders for how to donn it.  Donned in sitting)    Mobility  Bed Mobility Overal bed mobility: Needs Assistance Bed Mobility: Rolling;Sidelying to Sit Rolling: Min assist Sidelying to sit: Min assist       General bed mobility comments: Min assist to support trunk during transitions.  Verbal reminders for log roll technique  Transfers Overall transfer level: Needs assistance Equipment used: Rolling walker (2 wheeled) Transfers: Sit to/from [employer] CorporationStand;Stand Pivot Transfers Sit to Stand: Min assist Stand pivot transfers: Min assist       General transfer comment: Min assist to support trunk for balance and to stabilize RW during transitions  Ambulation/Gait Ambulation/Gait assistance: Min assist Ambulation Distance (Feet): 10 Feet Assistive device: Rolling  walker (2 wheeled) Gait Pattern/deviations: Step-through pattern;Decreased stance time - left;Decreased weight shift to left;Shuffle;Trunk flexed Gait velocity: decreased   General Gait Details: Pt with less antaligic gait pattern today, flexed trunk and reports of back pain throughout short distance gait.  Still has a mild antalgic gait pattern related to left knee pain, but not as bad as back pain today. DOE with gait 4/4 with 4 L O2 [place]          Balance Overall balance assessment: Needs assistance Sitting-balance support: Feet supported;Bilateral upper extremity supported Sitting balance-Leahy Scale: Poor Sitting balance - Comments: pt unable to release her upper extremities from bed in sitting today   Standing balance support: Bilateral upper extremity supported Standing balance-Leahy Scale: Poor Standing balance comment: needs external assist in stainding.                     Cognition Arousal/Alertness: Awake/alert Behavior During Therapy: Anxious Overall Cognitive Status: Within Functional Limits for tasks assessed                      Exercises Other Exercises Other Exercises: encouraged ankle pumps for antiembolic purposes.     General Comments General comments (skin integrity, edema, etc.): DOE continues to be 3-4/4 throughout all mobility. Once sitting on BSC it takes 3-4 mins for DOE to go back down to 2/4 on 4 L O2 [place]. She did maintain stats above 90% throghout the session today despite her DOE level.      Pertinent Vitals/Pain Pain  Assessment: Faces Pain Score: 2  (at rest) Faces Pain Scale: Hurts worst Pain Location: low back, left knee Pain Descriptors / Indicators: Aching;Burning;Constant;Crying Pain Intervention(s): Limited activity within patient's tolerance;Monitored during session;Repositioned (reported  pain to RN)           PT Goals (current goals can now be found in the care plan section) Acute Rehab PT Goals Patient Stated Goal:  to get better so she can go home Progress towards PT goals: Progressing toward goals    Frequency  Min 3X/week    PT Plan Current plan remains appropriate       End of Session Equipment Utilized During Treatment: Back brace;Oxygen Activity Tolerance: Patient limited by fatigue;Patient limited by pain;Other (comment) (limited by DOE) Patient left: in chair;with call bell/phone within reach;with chair alarm set;with family/visitor present     Time: [id number] PT Time Calculation (min): 28 min  Charges:  $Gait Training: 8-22 mins $Therapeutic Activity: 8-22 mins                      Mandrell Vangilder B. Jayleene Glaeser, PT, DPT [phone]   07/18/2014, 2:49 PM

## 2014-07-18 NOTE — Progress Notes (Signed)
Pt transported out of unit per stretcher by ambulance personnel. No distress noted.  Belongings sent with pt.  Andrew AuVafiadis, Charleigh Correnti I 07/18/2014 6:26 PM

## 2014-07-18 NOTE — Progress Notes (Signed)
*  Preliminary Results* Left lower extremity venous duplex completed. Left lower extremity is negative for deep vein thrombosis. There is evidence of superficial vein thrombosis involving the left greater saphenous vein in the mid left calf. There is no evidence of left Baker's cyst.  07/18/2014 12:32 PM  Gertie FeyMichelle Jacynda Brunke, RVT, RDCS, RDMS

## 2014-07-18 NOTE — Progress Notes (Signed)
Report given to [name][facility]N [facility] Pt to be transported out of unit per stretcher by ambulance personnel. Est time of departure yet to be done by Child psychotherapistocial Worker. Will monitor.  Andrew AuVafiadis, Wandell Scullion I 07/18/2014 4:11 PM

## 2014-07-18 NOTE — Discharge Summary (Signed)
Physician Discharge Summary  Patient ID: [name] MRN: [id number] DOB/AGE: 78-Jan-1922 78 y.o.  Admit date: 07/15/2014 Discharge date: 07/18/2014  Primary Care Physician:  Kristian Covey, MD  Discharge Diagnoses:    . Compression fracture of L1 lumbar vertebra . COPD (chronic obstructive pulmonary disease) . Essential hypertension Acute on chronic back pain  Consults:  Neurosurgery, Dr. Gerlene Fee   Recommendations for Outpatient Follow-up:  Continue TLSO brace, increase ambulation   Allergies:   Allergies  Allergen Reactions  . Amoxicillin     Blotches on legs  . Amoxicillin [Amoxicillin]      Discharge Medications:   Medication List    STOP taking these medications       ibuprofen 200 MG tablet  Commonly known as:  ADVIL,MOTRIN      TAKE these medications       acetaminophen 325 MG tablet  Commonly known as:  TYLENOL  Take 2 tablets (650 mg total) by mouth 3 (three) times daily.     albuterol 108 (90 BASE) MCG/ACT inhaler  Commonly known as:  PROVENTIL HFA;VENTOLIN HFA  Inhale 1-2 puffs into the lungs every 6 (six) hours as needed for wheezing or shortness of breath.     calcium-vitamin D 250-125 MG-UNIT per tablet  Commonly known as:  OSCAL  Take 1 tablet by mouth daily.     CENTRUM SILVER ULTRA WOMENS Tabs  Take 1 tablet by mouth daily.     clonazePAM 0.5 MG tablet  Commonly known as:  KLONOPIN  Take 0.5 mg by mouth at bedtime.     docusate sodium 100 MG capsule  Commonly known as:  COLACE  Take 1 capsule (100 mg total) by mouth 2 (two) times daily.     ferrous sulfate 325 (65 FE) MG tablet  Take 325 mg by mouth 2 (two) times daily.     Fish Oil 1200 MG Caps  Take 1 capsule by mouth daily.     gabapentin 100 MG capsule  Commonly known as:  NEURONTIN  Take 200 mg by mouth at bedtime.     hydrochlorothiazide 25 MG tablet  Commonly known as:  HYDRODIURIL  Take 25 mg by mouth daily.     HYDROcodone-acetaminophen 5-325 MG per  tablet  Commonly known as:  NORCO/VICODIN  Take 1-2 tablets by mouth every 6 (six) hours as needed for moderate pain.     methocarbamol 500 MG tablet  Commonly known as:  ROBAXIN  Take 1 tablet (500 mg total) by mouth every 8 (eight) hours as needed for muscle spasms.     pantoprazole 40 MG tablet  Commonly known as:  PROTONIX  Take 1 tablet (40 mg total) by mouth daily.     polyethylene glycol packet  Commonly known as:  MIRALAX  Take 17 g by mouth daily as needed for moderate constipation.     tiotropium 18 MCG inhalation capsule  Commonly known as:  SPIRIVA  Place 18 mcg into inhaler and inhale daily.         Brief H and P: For complete details please refer to admission H and P, but in briefHowell K [name] is a 80 y.o. female history of hypertension and COPD was brought to the ER after patient had a fall and was complaining of low back pain. Patient states she was walking when her legs gave way. Patient did hit her head but did not lose consciousness. In the ER CT head was negative for anything acute and CT lumbar spine showed  acute L1 lumbar compression fracture and on call neurosurgeon Dr. Gerlene FeeKritzer was consulted by the ED physician who had recommended TLSO brace. Since patient was finding it very difficult to move around because of pain patient has been admitted for further pain management. Patient denied any chest pain or shortness of breath or any palpitations dizziness. Patient has had a previous fall about one week ago.    Hospital Course:   Patient is a 80 year old female with history of hypertension, COPD was brought to the ER after patient had a fall and complaining of low back pain. CT head was negative however CT lumbar spine showed acute L1 lumbar compression fracture. Neurosurgery consulted and recommended TLSO brace.  Compression fracture of L1 lumbar vertebra  Patient was admitted and neurosurgery was consulted. Dr. Gerlene FeeKritzer recommended conservative management with  TLSO brace, increase ambulation, PT, OT.  Patient was placed on scheduled Tylenol and Toradol for pain control initially which did improve her ability to do physical therapy. She is doing significantly better now and can continue oral pain medications as needed.  Acute on chronic back pain improving- continue Tylenol scheduled for 1-2 weeks until pain improved, Norco, Robaxin as needed, continue bowel regimen  Essential hypertension  Stable   COPD (chronic obstructive pulmonary disease)  - Currently stable   Left lower extremity edema: Doppler US negative for DVT.  Day of Discharge BP 138/68  Pulse 58  Temp(Src) 97.9 F (36.6 C) (Oral)  Resp 22  Ht 5\' 6"  (1.676 m)  Wt 53.434 kg (117 lb 12.8 oz)  BMI 19.02 kg/m2  SpO2 100%  Physical Exam: General: Alert and awake oriented x3 not in any acute distress. CVS: S1-S2 clear no murmur rubs or gallops Chest: clear to auscultation bilaterally, no wheezing rales or rhonchi Abdomen: soft nontender, nondistended, normal bowel sounds Extremities: no cyanosis, clubbing, LLE edema 1+, RLE no edema  Neuro: Cranial nerves II-XII intact, no focal neurological deficits   The results of significant diagnostics from this hospitalization (including imaging, microbiology, ancillary and laboratory) are listed below for reference.    LAB RESULTS: Basic Metabolic Panel:  Recent Labs Lab 07/15/14 1844 07/16/14 0504  NA 138 140  K 4.0 3.9  CL 98 98  CO2 29 30  GLUCOSE 131* 107*  BUN 17 18  CREATININE 0.67 0.68  CALCIUM 9.2 8.9   Liver Function Tests:  Recent Labs Lab 07/16/14 0504  AST 32  ALT 11  ALKPHOS 87  BILITOT 0.5  PROT 6.4  ALBUMIN 2.9*   No results found for this basename: LIPASE, AMYLASE,  in the last 168 hours No results found for this basename: AMMONIA,  in the last 168 hours CBC:  Recent Labs Lab 07/15/14 1844 07/16/14 0504  WBC 7.5 7.7  NEUTROABS 6.6 6.9  HGB 9.9* 9.4*  HCT 29.8* 29.1*  MCV 91.1 95.1  PLT  270 237   Cardiac Enzymes: No results found for this basename: CKTOTAL, CKMB, CKMBINDEX, TROPONINI,  in the last 168 hours BNP: No components found with this basename: POCBNP,  CBG: No results found for this basename: GLUCAP,  in the last 168 hours  Significant Diagnostic Studies:  Dg Thoracic Spine 2 View  07/15/2014   CLINICAL DATA:  Larey SeatFell today.  Mid and lower back pain.  EXAM: THORACIC SPINE - 2 VIEW; LUMBAR SPINE - COMPLETE 4+ VIEW  COMPARISON:  Lateral chest x-ray 05/12/2011  FINDINGS: Thoracic spine:  Normal alignment of the thoracic vertebral bodies. There are remote compression fractures without definite  new/acute compression fracture. No abnormal paraspinal soft tissue swelling. Aortic calcifications are noted.  Lumbar spine: Moderate degenerative lumbar spondylosis with multilevel disc disease and facet disease. There is a superior endplate depression of L1 which is new since 2012 but of uncertain acuity. No definite pars defects. The visualized bony pelvis is intact. Extensive vascular calcifications are noted.  IMPRESSION: Remote thoracic compression fractures.  No acute fracture.  L1 compression deformity of uncertain age.  It is new since 2012.   Electronically Signed   By: Loralie Champagne M.D.   On: 07/15/2014 10:57   Dg Lumbar Spine Complete  07/15/2014   CLINICAL DATA:  Larey Seat today.  Mid and lower back pain.  EXAM: THORACIC SPINE - 2 VIEW; LUMBAR SPINE - COMPLETE 4+ VIEW  COMPARISON:  Lateral chest x-ray 05/12/2011  FINDINGS: Thoracic spine:  Normal alignment of the thoracic vertebral bodies. There are remote compression fractures without definite new/acute compression fracture. No abnormal paraspinal soft tissue swelling. Aortic calcifications are noted.  Lumbar spine: Moderate degenerative lumbar spondylosis with multilevel disc disease and facet disease. There is a superior endplate depression of L1 which is new since 2012 but of uncertain acuity. No definite pars defects. The  visualized bony pelvis is intact. Extensive vascular calcifications are noted.  IMPRESSION: Remote thoracic compression fractures.  No acute fracture.  L1 compression deformity of uncertain age.  It is new since 2012.   Electronically Signed   By: Loralie Champagne M.D.   On: 07/15/2014 10:57   Ct Head Wo Contrast  07/15/2014   CLINICAL DATA:  Status post fall today.  EXAM: CT HEAD WITHOUT CONTRAST  TECHNIQUE: Contiguous axial images were obtained from the base of the skull through the vertex without intravenous contrast.  COMPARISON:  None.  FINDINGS: There is chronic microvascular ischemic change. No evidence of acute infarction, hemorrhage, mass lesion, mass effect midline shift abnormal extra-axial fluid collection is identified. There is no hydrocephalus or pneumocephalus. The calvarium is intact.  IMPRESSION: No acute finding.  Atrophy and chronic microvascular ischemic change.   Electronically Signed   By: Drusilla Kanner M.D.   On: 07/15/2014 19:53   Ct Lumbar Spine Wo Contrast  07/15/2014   CLINICAL DATA:  Status post fall, back pain  EXAM: CT LUMBAR SPINE WITHOUT CONTRAST  TECHNIQUE: Multidetector CT imaging of the lumbar spine was performed without intravenous contrast administration. Multiplanar CT image reconstructions were also generated.  COMPARISON:  None.  FINDINGS: The alignment is anatomic. There is an acute L1 vertebral body compression fracture through the superior endplate with approximately 20% depression. There is 4 mm of retropulsion of the superior posterior margin of the L1 vertebral body with mild impression on the ventral thecal sac.  The remainder the vertebral body heights are maintained. There is no acute fracture or static listhesis. The paravertebral soft tissues are normal. The intraspinal soft tissues are not fully imaged on this examination due to poor soft tissue contrast, but there is no gross soft tissue abnormality.  There is degenerative disc disease at L3-4 and L4-5.  There is mild bilateral facet arthropathy throughout the lumbar spine.  There is abdominal aortic atherosclerosis.  IMPRESSION: 1. Acute, L1 vertebral body compression fracture through the superior endplate with approximately 20% depression. 4 mm of retropulsion of the superior posterior margin of L1 with mild flattening of the ventral thecal sac.   Electronically Signed   By: Elige Ko   On: 07/15/2014 12:57       Disposition and  Follow-up: Discharge Instructions   Diet - low sodium heart healthy    Complete by:  As directed      Increase activity slowly    Complete by:  As directed             DISPOSITION: Skilled nursing facility  DIET: Heart healthy diet   DISCHARGE FOLLOW-UP Follow-up Information   Follow up with Reinaldo MeekerKRITZER,RANDY O, MD. Schedule an appointment as soon as possible for a visit in 2 weeks. (for hospital follow-up)    Specialty:  Neurosurgery   Contact information:   [address] [place] [address] [phone]       Follow up with Kristian CoveyBURCHETTE,BRUCE W, MD. Schedule an appointment as soon as possible for a visit in 2 weeks. (for hospital follow-up)    Specialty:  Family Medicine   Contact information:   [address] [place] [address] [phone]       Time spent on Discharge: 40 minutes  Signed:   Bijou Easler M.D. [facility] 07/18/2014, 2:20 PM Pager: [phone]

## 2014-08-08 ENCOUNTER — Telehealth: Payer: Self-pay | Admitting: Family Medicine

## 2014-08-08 NOTE — Telephone Encounter (Signed)
Dr Gerlene FeeKritzer says that she is fine to just follow up with PCP but that if you still want her to see him in consult that would be ok as well. Just let them your decision please and comfort level.

## 2014-08-10 NOTE — Telephone Encounter (Signed)
We are happy to follow if neurosurgeon feels she is stable.

## 2014-08-11 NOTE — Telephone Encounter (Signed)
Left message for [name] to return call.

## 2014-08-12 NOTE — Telephone Encounter (Signed)
Chris is informed 

## 2014-08-27 ENCOUNTER — Ambulatory Visit: Payer: Medicare Other | Admitting: Family Medicine

## 2014-09-03 ENCOUNTER — Ambulatory Visit: Payer: Medicare Other | Admitting: Family Medicine

## 2014-10-27 DEATH — deceased

## 2015-08-14 IMAGING — CT CT L SPINE W/O CM
3 series · 11 of 33 positions shown, 13 images · non-contrast
Comparison: None.

CLINICAL DATA: Status post fall, back pain

EXAM:
CT LUMBAR SPINE WITHOUT CONTRAST
TECHNIQUE: Multidetector CT imaging of the lumbar spine was performed without
intravenous contrast administration. Multiplanar CT image
reconstructions were also generated.

[Series 4: l spine 2.0 i30s 3 · axial · 0.25mm/px · z∈[+1257,+1407]mm · 3 of 123 slices shown, 4 images]
[im 29/123  soft-tissue]
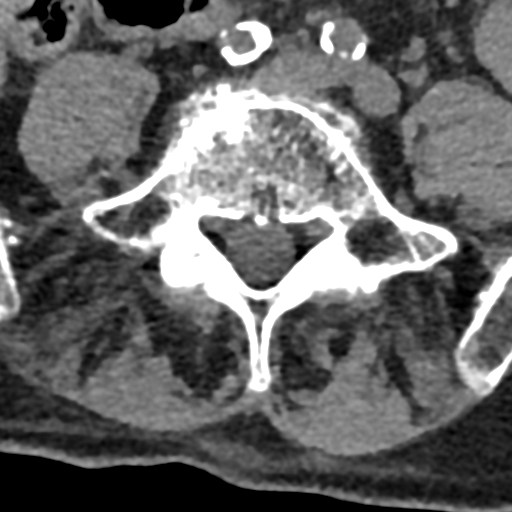
[im 29/123  bone]
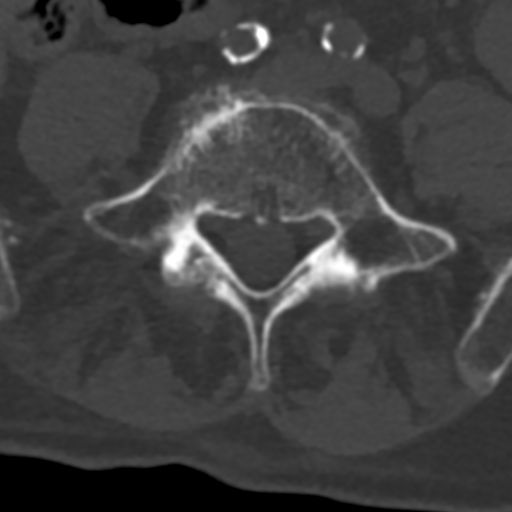
[im 66/123  bone]
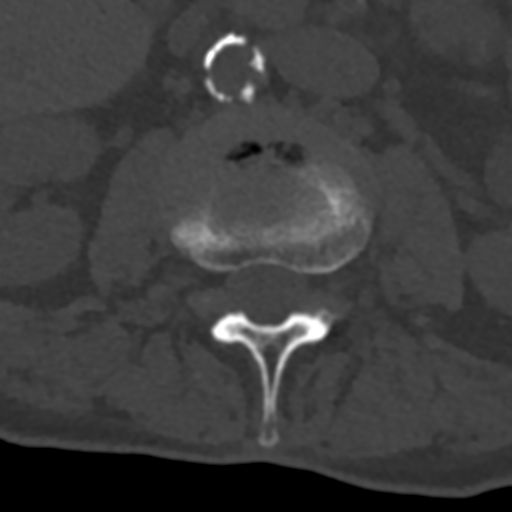
[im 104/123  bone]
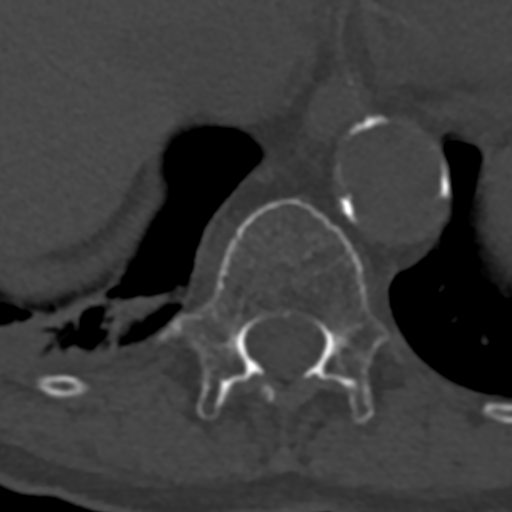

[Series 7: coronal st · coronal · 0.27mm/px · 3 of 57 slices shown]
[im 12/57  bone]
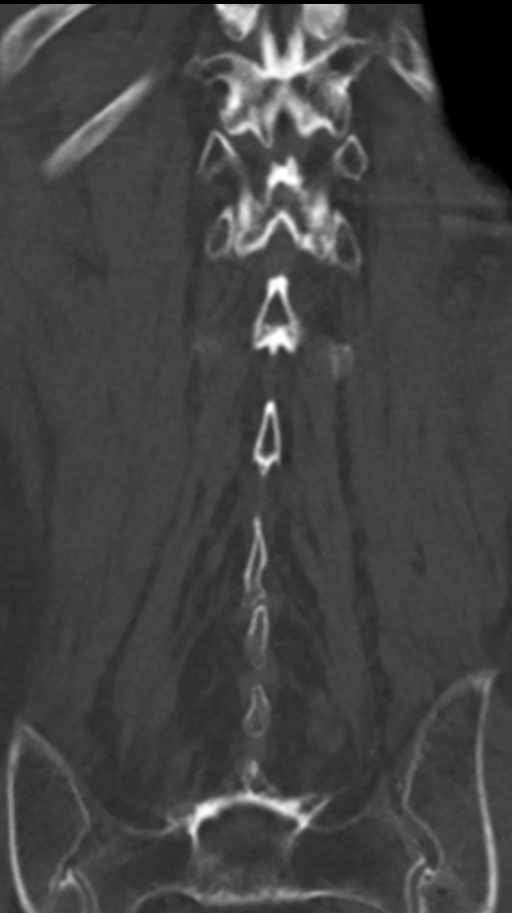
[im 23/57  bone]
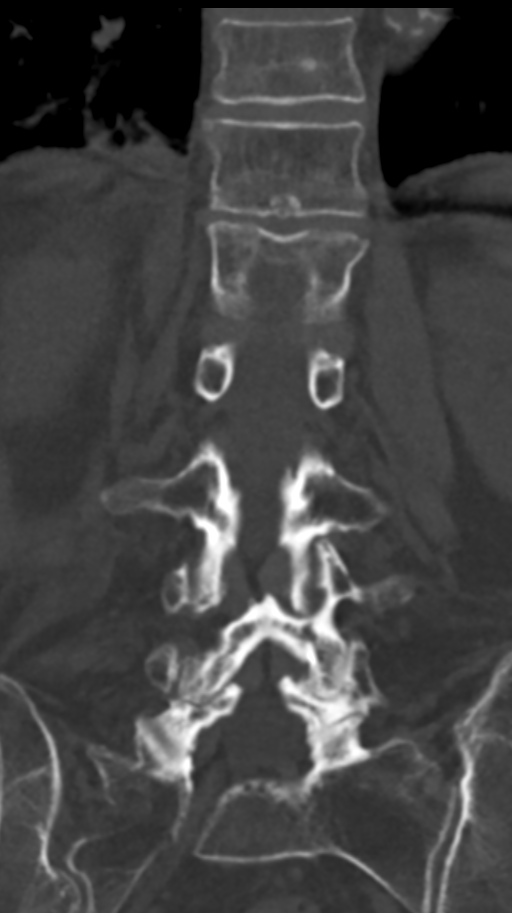
[im 34/57  bone]
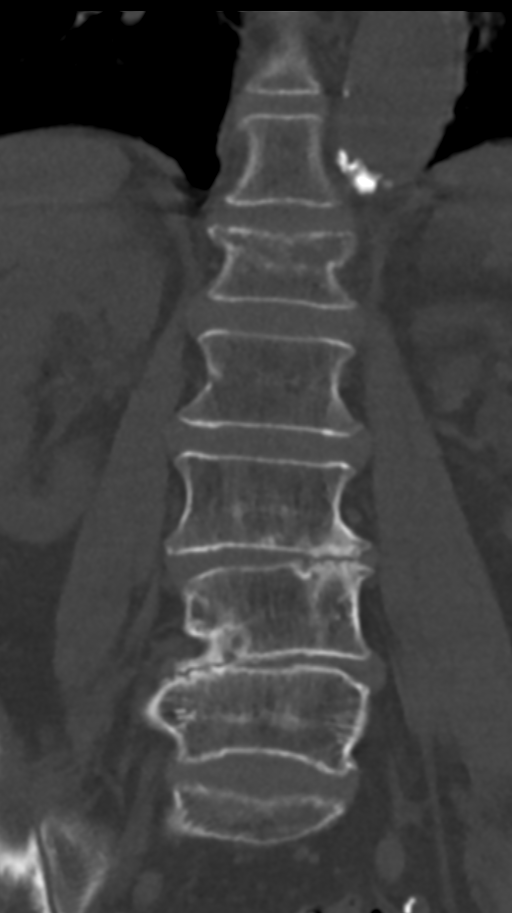

[Series 8: sagittal st · sagittal · 0.28mm/px · 5 of 61 slices shown, 6 images]
[im 21/61  bone]
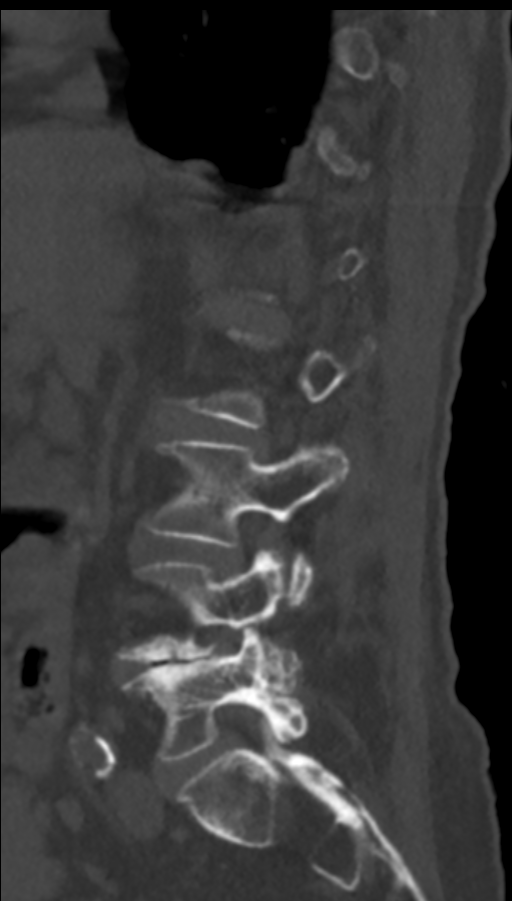
[im 26/61  bone]
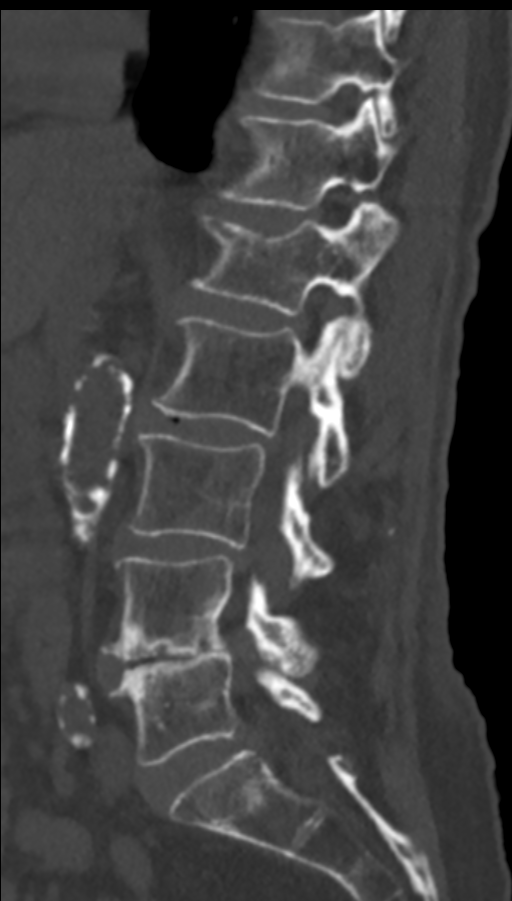
[im 31/61  soft-tissue]
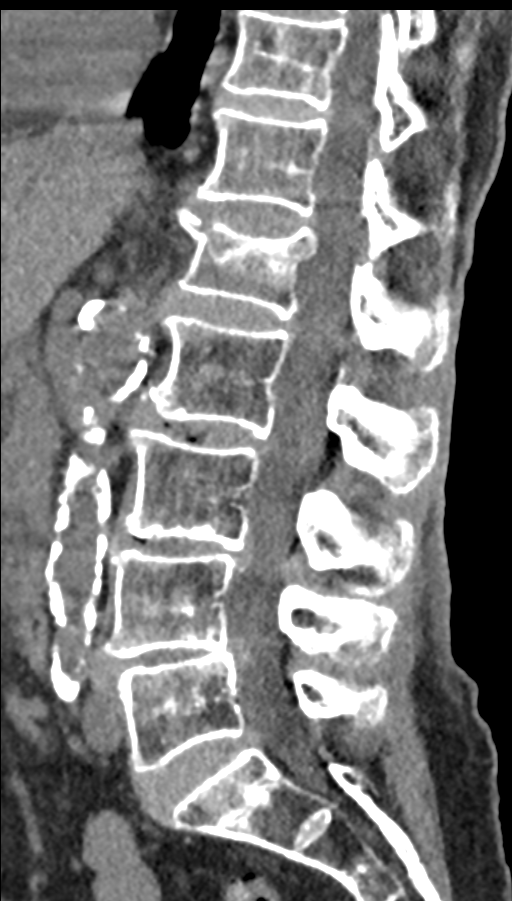
[im 31/61  bone]
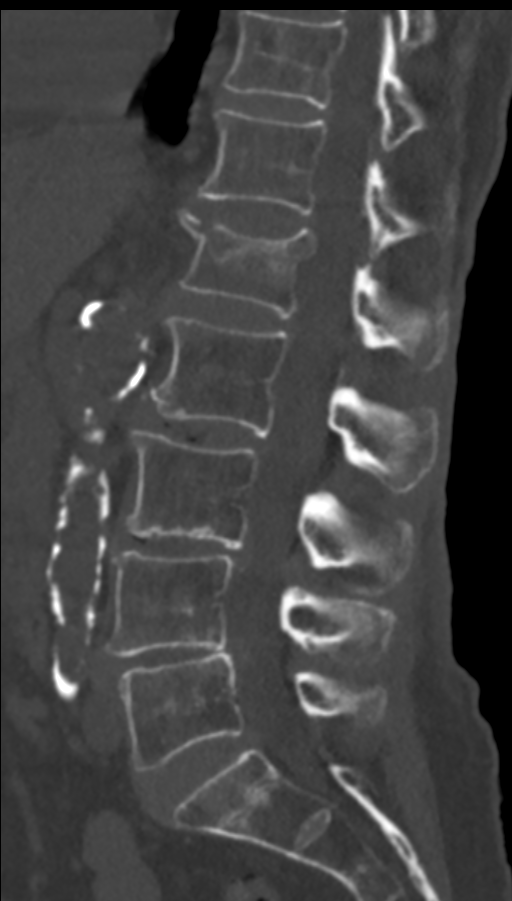
[im 36/61  bone]
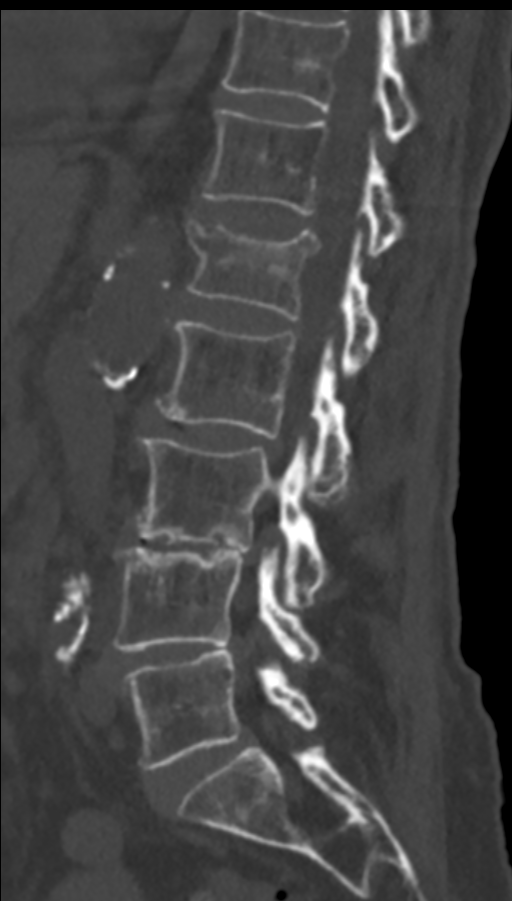
[im 41/61  bone]
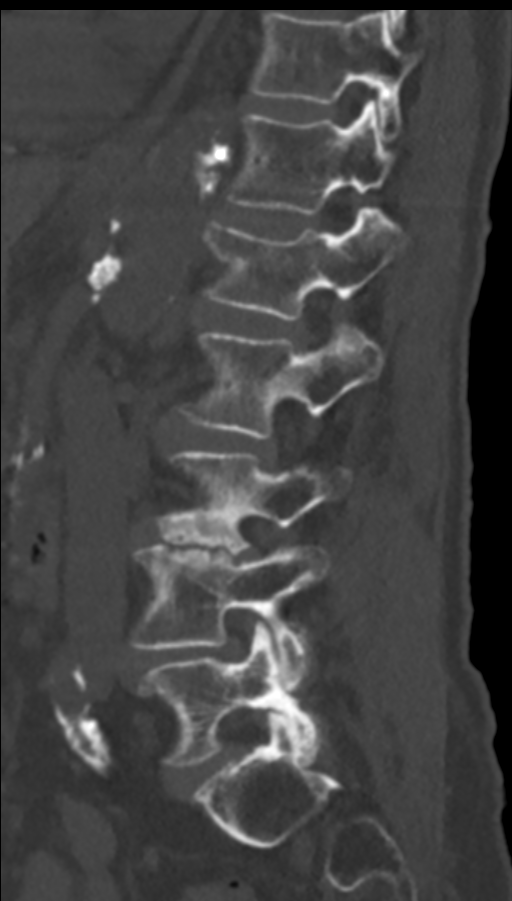

[11 of 33 positions shown; findings below may reference images not displayed]

FINDINGS: The alignment is anatomic. There is an acute L1 vertebral body
compression fracture through the superior endplate with
approximately 20% depression. There is 4 mm of retropulsion of the
superior posterior margin of the L1 vertebral body with mild
impression on the ventral thecal sac.

The remainder the vertebral body heights are maintained. There is no
acute fracture or static listhesis. The paravertebral soft tissues
are normal. The intraspinal soft tissues are not fully imaged on
this examination due to poor soft tissue contrast, but there is no
gross soft tissue abnormality.

There is degenerative disc disease at L3-4 and L4-5. There is mild
bilateral facet arthropathy throughout the lumbar spine.

There is abdominal aortic atherosclerosis.
IMPRESSION: 1. Acute, L1 vertebral body compression fracture through the
superior endplate with approximately 20% depression. 4 mm of
retropulsion of the superior posterior margin of L1 with mild
flattening of the ventral thecal sac.

## 2015-08-14 IMAGING — CR DG THORACIC SPINE 2V
3 series · 3 of 3 positions shown · non-contrast
Comparison: Lateral chest x-ray 05/12/2011

CLINICAL DATA: Fell today.  Mid and lower back pain.

EXAM:
THORACIC SPINE - 2 VIEW; LUMBAR SPINE - COMPLETE 4+ VIEW

[t thoracic spine ap]
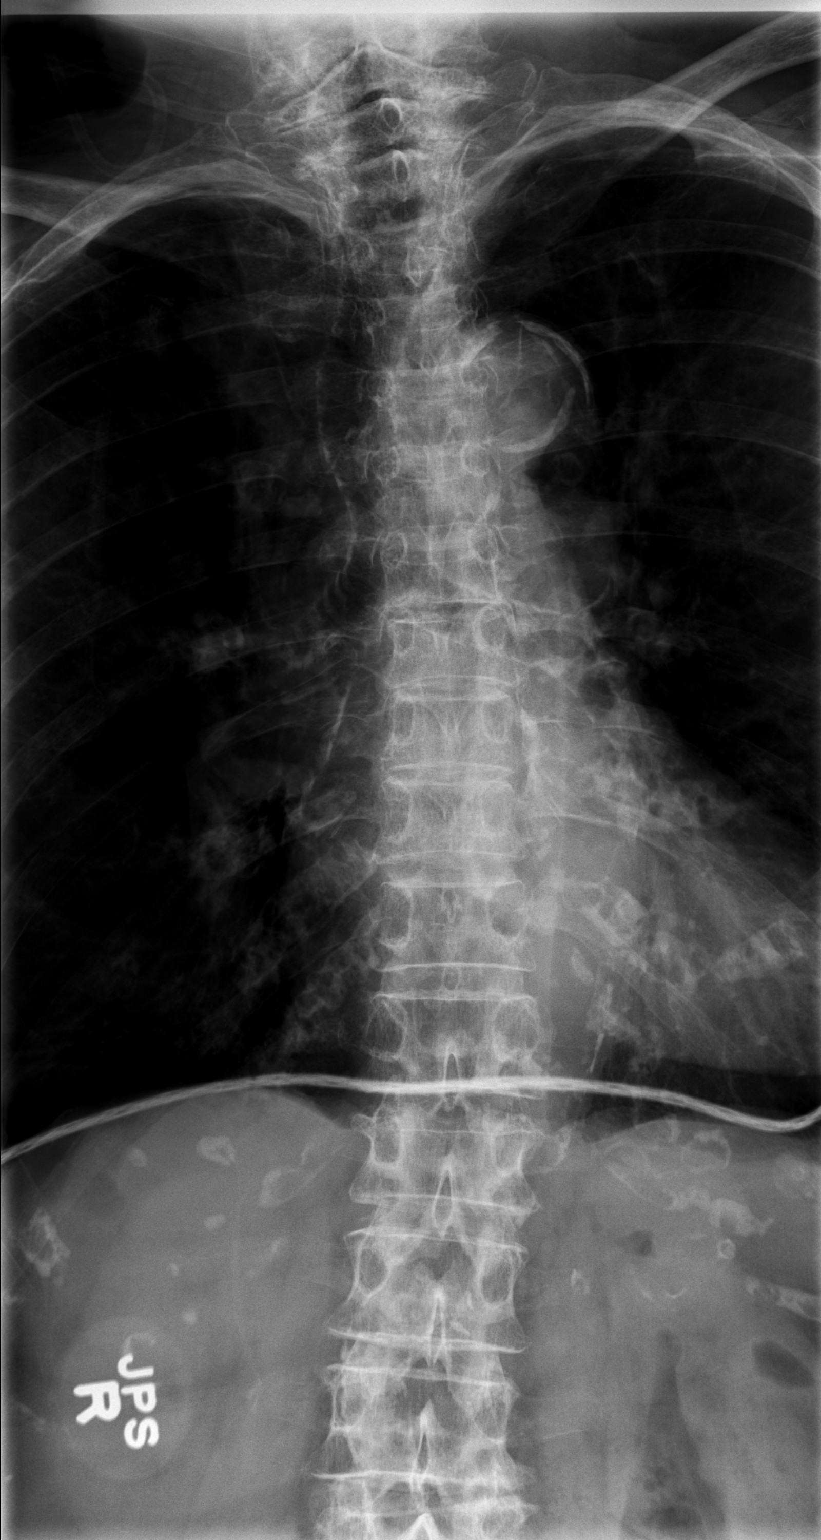

[t thoracic spine lat]
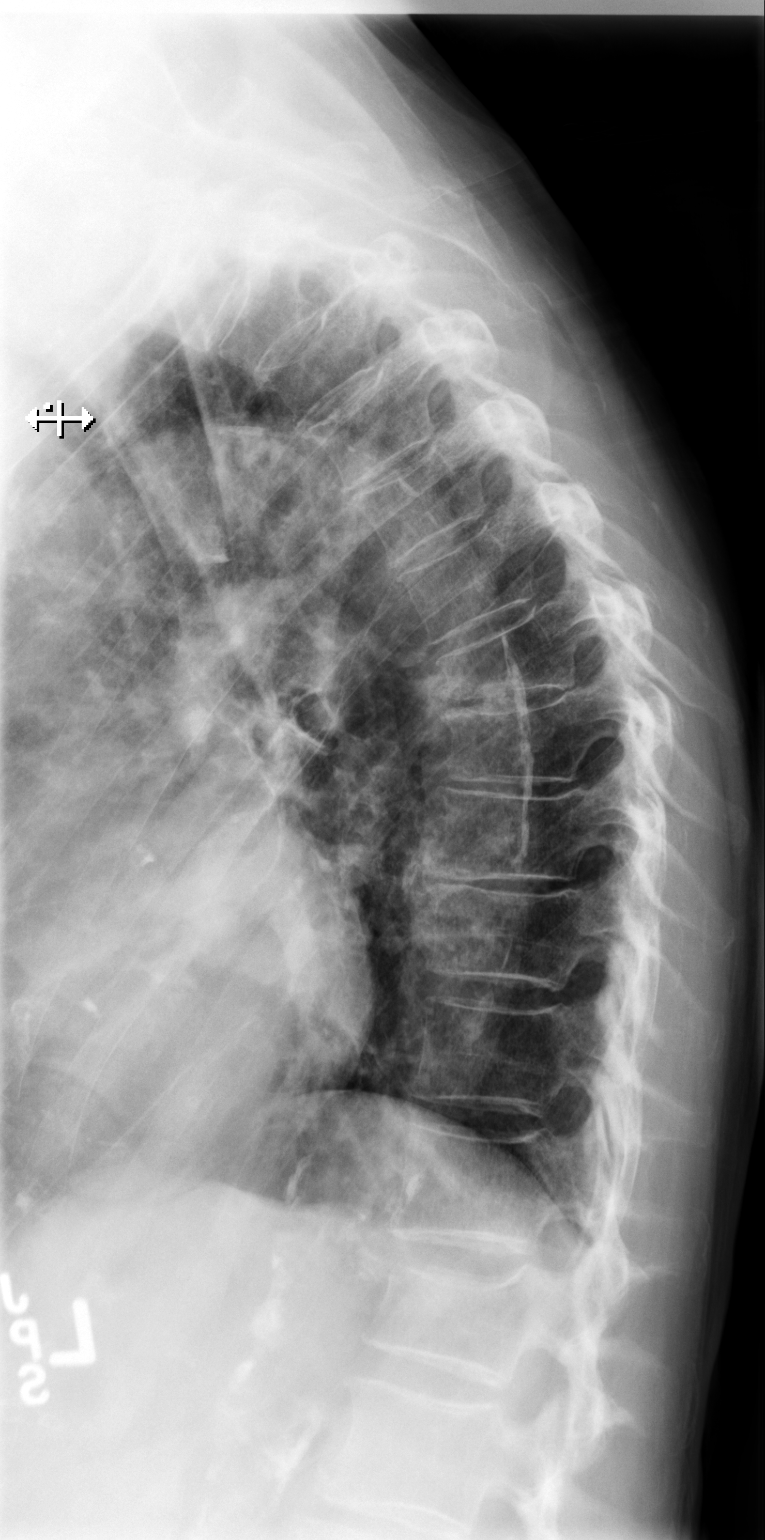

[t thoracic swimmers]
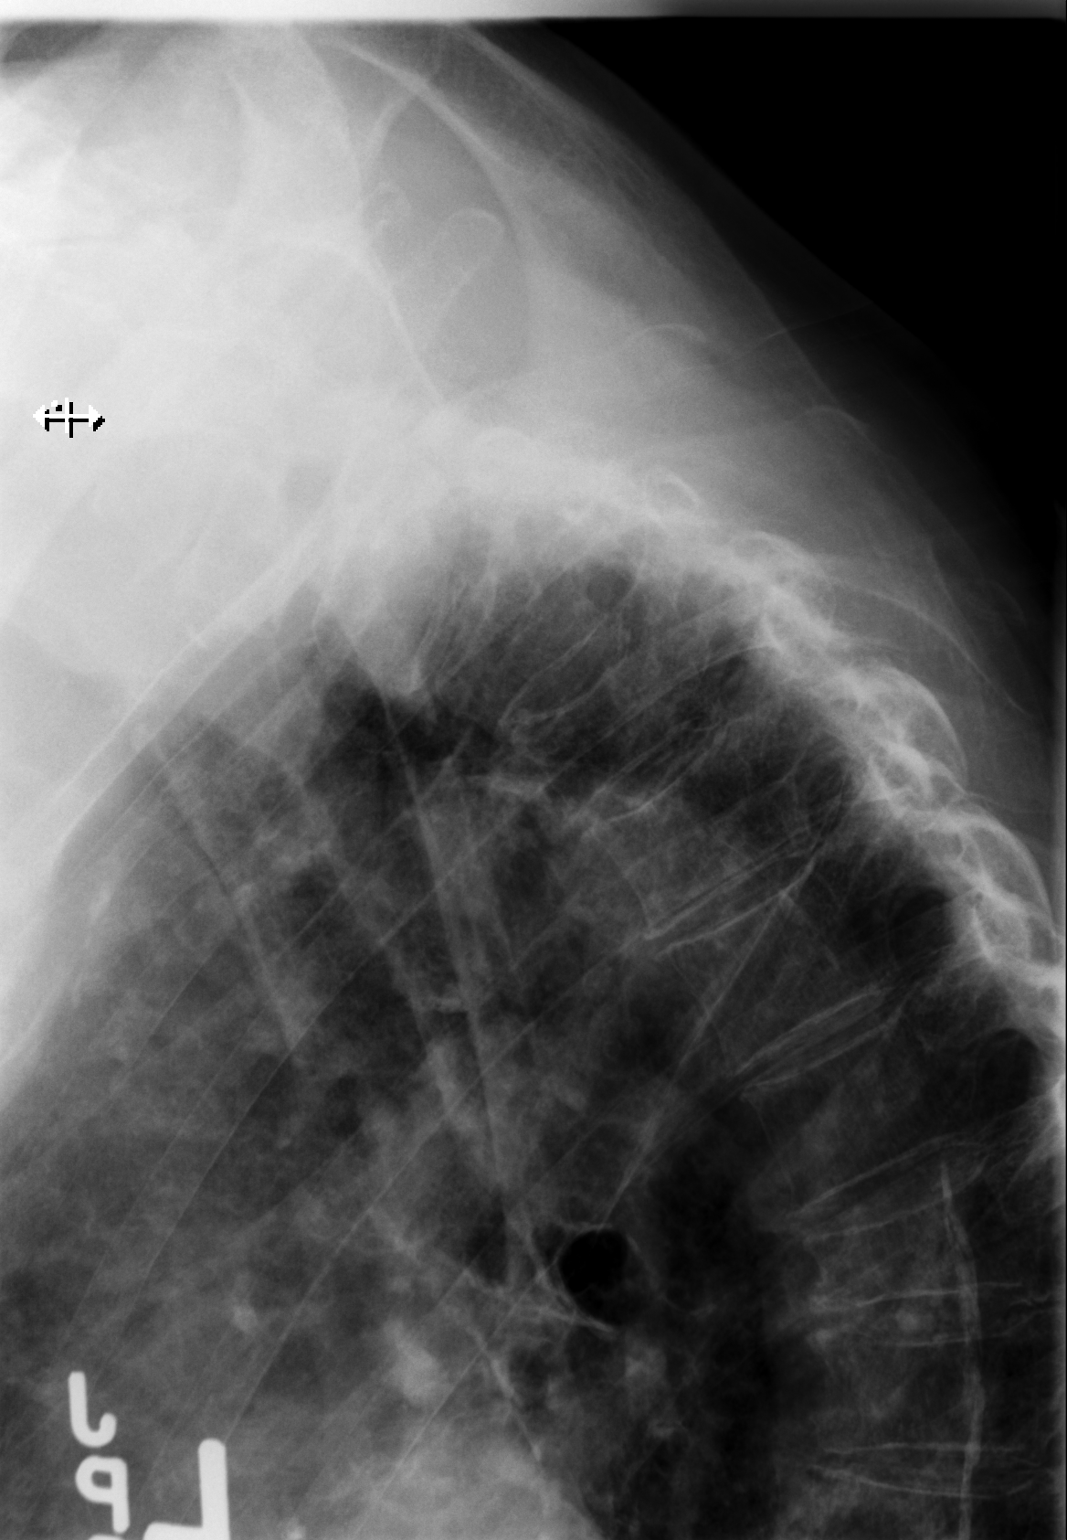

[3 of 3 positions shown; findings below may reference images not displayed]

FINDINGS: Thoracic spine:

Normal alignment of the thoracic vertebral bodies. There are remote
compression fractures without definite new/acute compression
fracture. No abnormal paraspinal soft tissue swelling. Aortic
calcifications are noted.

Lumbar spine: Moderate degenerative lumbar spondylosis with
multilevel disc disease and facet disease. There is a superior
endplate depression of L1 which is new since 1131 but of uncertain
acuity. No definite pars defects. The visualized bony pelvis is
intact. Extensive vascular calcifications are noted.
IMPRESSION: Remote thoracic compression fractures.  No acute fracture.

L1 compression deformity of uncertain age.  It is new since [DATE].
